# Patient Record
Sex: Male | Born: 1950 | Race: White | Hispanic: No | Marital: Single | Smoking: Never smoker
Health system: Southern US, Community
[De-identification: ages and names within clinical notes are randomized; demographics above are authoritative.]

## PROBLEM LIST (undated history)

## (undated) DIAGNOSIS — C4491 Basal cell carcinoma of skin, unspecified: Secondary | ICD-10-CM

## (undated) DIAGNOSIS — N189 Chronic kidney disease, unspecified: Secondary | ICD-10-CM

## (undated) DIAGNOSIS — I10 Essential (primary) hypertension: Secondary | ICD-10-CM

## (undated) DIAGNOSIS — M1990 Unspecified osteoarthritis, unspecified site: Secondary | ICD-10-CM

## (undated) DIAGNOSIS — E119 Type 2 diabetes mellitus without complications: Secondary | ICD-10-CM

## (undated) DIAGNOSIS — Z973 Presence of spectacles and contact lenses: Secondary | ICD-10-CM

## (undated) DIAGNOSIS — D649 Anemia, unspecified: Secondary | ICD-10-CM

## (undated) HISTORY — DX: Chronic kidney disease, unspecified: N18.9

---

## 2000-04-23 ENCOUNTER — Ambulatory Visit (HOSPITAL_COMMUNITY): Admission: RE | Admit: 2000-04-23 | Discharge: 2000-04-24 | Payer: Self-pay | Admitting: Ophthalmology

## 2002-04-13 ENCOUNTER — Encounter: Payer: Self-pay | Admitting: Family Medicine

## 2002-04-13 ENCOUNTER — Inpatient Hospital Stay (HOSPITAL_COMMUNITY): Admission: EM | Admit: 2002-04-13 | Discharge: 2002-04-18 | Payer: Self-pay

## 2002-09-11 HISTORY — PX: RETINAL DETACHMENT SURGERY: SHX105

## 2012-08-21 ENCOUNTER — Other Ambulatory Visit (HOSPITAL_COMMUNITY): Payer: Self-pay | Admitting: Family Medicine

## 2012-08-21 DIAGNOSIS — N289 Disorder of kidney and ureter, unspecified: Secondary | ICD-10-CM

## 2012-08-26 ENCOUNTER — Ambulatory Visit (HOSPITAL_COMMUNITY)
Admission: RE | Admit: 2012-08-26 | Discharge: 2012-08-26 | Disposition: A | Discharge status: Home / Self Care | Payer: BC Managed Care – PPO | Source: Ambulatory Visit | Attending: Family Medicine | Admitting: Family Medicine

## 2012-08-26 DIAGNOSIS — N289 Disorder of kidney and ureter, unspecified: Secondary | ICD-10-CM

## 2012-08-26 DIAGNOSIS — N281 Cyst of kidney, acquired: Secondary | ICD-10-CM | POA: Insufficient documentation

## 2013-01-10 ENCOUNTER — Encounter (HOSPITAL_BASED_OUTPATIENT_CLINIC_OR_DEPARTMENT_OTHER): Payer: Self-pay | Admitting: *Deleted

## 2013-01-10 ENCOUNTER — Encounter (HOSPITAL_BASED_OUTPATIENT_CLINIC_OR_DEPARTMENT_OTHER)
Admission: RE | Admit: 2013-01-10 | Discharge: 2013-01-10 | Disposition: A | Discharge status: Home / Self Care | Payer: BC Managed Care – PPO | Source: Ambulatory Visit | Attending: Plastic Surgery | Admitting: Plastic Surgery

## 2013-01-10 ENCOUNTER — Other Ambulatory Visit: Payer: Self-pay

## 2013-01-10 ENCOUNTER — Other Ambulatory Visit: Payer: Self-pay | Admitting: Physician Assistant

## 2013-01-10 DIAGNOSIS — M952 Other acquired deformity of head: Secondary | ICD-10-CM | POA: Insufficient documentation

## 2013-01-10 DIAGNOSIS — C44309 Unspecified malignant neoplasm of skin of other parts of face: Secondary | ICD-10-CM

## 2013-01-10 LAB — BASIC METABOLIC PANEL
BUN: 22 mg/dL (ref 6–23)
Chloride: 102 mEq/L (ref 96–112)
GFR calc Af Amer: 60 mL/min — ABNORMAL LOW (ref >90)
Potassium: 6.3 mEq/L (ref 3.5–5.1)
Sodium: 134 mEq/L — ABNORMAL LOW (ref 135–145)

## 2013-01-10 NOTE — Progress Notes (Signed)
Labs came back k-6.3 Had called for pcp labs and notes-called again Dr Kelly Splinter notified-Shawn-PA said to repeat [facility]

## 2013-01-10 NOTE — Progress Notes (Signed)
Pt here for bloodwork and EKG. Abnormal EKG reviewed with Dr. Chaney Malling.

## 2013-01-10 NOTE — H&P (Signed)
  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  He has high blood pressure and diabetes.  He is not sure how long the lesion had been present.  According to the notes, it was a 5 x 3 cm sclerotic illdefined plaque with an erythematous nodule. He has not done anything with it over the past week but kept the dressing in place. There is exudate on the area and a foul odor and Silvercel was placed on the area earlier this week. We are working on getting him to the OR for either a STSG or full thickness graft. He is seen for wound re-check today. There is exudate on the area and a foul odor and silvadene cream was applied to the wound and covered with gauze.  The wound measures 7.0 cm x 7.5 cm x 0.2 cm.    The following portions of the patient's history were reviewed and updated as appropriate: allergies, current medications, past family history, past medical history, past social history, past surgical history and problem list.  Review of Systems  All other systems reviewed and are negative.  Objective:     Physical Exam  Constitutional: He is oriented to person, place, and time. He appears well-developed and well-nourished. No distress.  HENT:   Nose: Nose normal.   Mouth/Throat: Oropharynx is clear and moist.  The forehead wound is - There is exudate on the area and a foul odor and silvadene cream was applied to the wound and covered with gauze.  The wound measures 7.0 cm x 7.5 cm x 0.2 cm.   Eyes: EOM are normal. Pupils are equal, round, and reactive to light.  Neck: Normal range of motion. Neck supple.  Cardiovascular: Normal rate and regular rhythm.   Pulmonary/Chest: Effort normal. No respiratory distress.  Musculoskeletal: Normal range of motion.  Neurological: He is alert and oriented to person, place, and time.  Skin: Skin is warm and dry.  Psychiatric: He has a normal mood and  affect. His behavior is normal. Judgment and thought content normal.  Assessment:        1.  Mohs defect of forehead    2.  Skin cancer of forehead        Plan:      To OR for closure of Mohs defect with either split thickness or full thickness skin graft as soon as possible.  Will start empiric Levaquin as wound with foul odor and exudate.

## 2013-01-10 NOTE — Progress Notes (Signed)
To come in for [id number] dr wilson-called for notes

## 2013-01-13 ENCOUNTER — Other Ambulatory Visit: Payer: Self-pay | Admitting: Plastic Surgery

## 2013-01-13 ENCOUNTER — Encounter (HOSPITAL_BASED_OUTPATIENT_CLINIC_OR_DEPARTMENT_OTHER): Payer: Self-pay | Admitting: Certified Registered"

## 2013-01-13 ENCOUNTER — Encounter (HOSPITAL_BASED_OUTPATIENT_CLINIC_OR_DEPARTMENT_OTHER)
Admission: RE | Disposition: A | Discharge status: Home / Self Care | Payer: Self-pay | Source: Ambulatory Visit | Attending: Plastic Surgery

## 2013-01-13 ENCOUNTER — Ambulatory Visit (HOSPITAL_BASED_OUTPATIENT_CLINIC_OR_DEPARTMENT_OTHER): Payer: BC Managed Care – PPO | Admitting: Certified Registered"

## 2013-01-13 ENCOUNTER — Ambulatory Visit (HOSPITAL_BASED_OUTPATIENT_CLINIC_OR_DEPARTMENT_OTHER)
Admission: RE | Admit: 2013-01-13 | Discharge: 2013-01-13 | Disposition: A | Discharge status: Home / Self Care | Payer: BC Managed Care – PPO | Source: Ambulatory Visit | Attending: Plastic Surgery | Admitting: Plastic Surgery

## 2013-01-13 ENCOUNTER — Encounter (HOSPITAL_BASED_OUTPATIENT_CLINIC_OR_DEPARTMENT_OTHER): Payer: Self-pay | Admitting: *Deleted

## 2013-01-13 DIAGNOSIS — Z481 Encounter for planned postprocedural wound closure: Secondary | ICD-10-CM | POA: Insufficient documentation

## 2013-01-13 DIAGNOSIS — I10 Essential (primary) hypertension: Secondary | ICD-10-CM | POA: Insufficient documentation

## 2013-01-13 DIAGNOSIS — E119 Type 2 diabetes mellitus without complications: Secondary | ICD-10-CM | POA: Insufficient documentation

## 2013-01-13 DIAGNOSIS — C44319 Basal cell carcinoma of skin of other parts of face: Secondary | ICD-10-CM | POA: Insufficient documentation

## 2013-01-13 HISTORY — DX: Anemia, unspecified: D64.9

## 2013-01-13 HISTORY — DX: Essential (primary) hypertension: I10

## 2013-01-13 HISTORY — DX: Unspecified osteoarthritis, unspecified site: M19.90

## 2013-01-13 HISTORY — DX: Presence of spectacles and contact lenses: Z97.3

## 2013-01-13 HISTORY — PX: SKIN SPLIT GRAFT: SHX444

## 2013-01-13 HISTORY — DX: Type 2 diabetes mellitus without complications: E11.9

## 2013-01-13 LAB — POCT I-STAT, CHEM 8
BUN: 33 mg/dL — ABNORMAL HIGH (ref 6–23)
Chloride: 106 mEq/L (ref 96–112)
Creatinine, Ser: 1.5 mg/dL — ABNORMAL HIGH (ref 0.50–1.35)
Glucose, Bld: 190 mg/dL — ABNORMAL HIGH (ref 70–99)
Hemoglobin: 11.6 g/dL — ABNORMAL LOW (ref 13.0–17.0)
Potassium: 5.4 mEq/L — ABNORMAL HIGH (ref 3.5–5.1)
Sodium: 135 mEq/L (ref 135–145)

## 2013-01-13 SURGERY — APPLICATION, GRAFT, SKIN, SPLIT-THICKNESS
Anesthesia: General | Site: Face | Wound class: Contaminated

## 2013-01-13 MED ORDER — PROPOFOL 10 MG/ML IV BOLUS
INTRAVENOUS | Status: DC | PRN
  Administered 2013-01-13: 120 mg via INTRAVENOUS

## 2013-01-13 MED ORDER — SODIUM CHLORIDE 0.9 % IR SOLN
Status: DC | PRN
  Administered 2013-01-13: 15:00:00

## 2013-01-13 MED ORDER — LACTATED RINGERS IV SOLN
INTRAVENOUS | Status: DC
  Administered 2013-01-13 (×2): via INTRAVENOUS

## 2013-01-13 MED ORDER — EPHEDRINE SULFATE 50 MG/ML IJ SOLN
INTRAMUSCULAR | Status: DC | PRN
  Administered 2013-01-13: 5 mg via INTRAVENOUS

## 2013-01-13 MED ORDER — OXYCODONE HCL 5 MG/5ML PO SOLN: 5.0000 mg | Freq: Once | ORAL | Status: DC | PRN

## 2013-01-13 MED ORDER — CEFAZOLIN SODIUM-DEXTROSE 2-3 GM-% IV SOLR
2.0000 g | INTRAVENOUS | Status: AC
  Administered 2013-01-13: 2 g via INTRAVENOUS

## 2013-01-13 MED ORDER — BUPIVACAINE-EPINEPHRINE 0.25% -1:200000 IJ SOLN
INTRAMUSCULAR | Status: DC | PRN
  Administered 2013-01-13: 10 mL

## 2013-01-13 MED ORDER — MIDAZOLAM HCL 5 MG/5ML IJ SOLN
INTRAMUSCULAR | Status: DC | PRN
  Administered 2013-01-13: 1 mg via INTRAVENOUS

## 2013-01-13 MED ORDER — ONDANSETRON HCL 4 MG/2ML IJ SOLN
INTRAMUSCULAR | Status: DC | PRN
  Administered 2013-01-13: 4 mg via INTRAVENOUS

## 2013-01-13 MED ORDER — METOCLOPRAMIDE HCL 5 MG/ML IJ SOLN: 10.0000 mg | Freq: Once | INTRAMUSCULAR | Status: DC | PRN

## 2013-01-13 MED ORDER — FENTANYL CITRATE 0.05 MG/ML IJ SOLN
INTRAMUSCULAR | Status: DC | PRN
  Administered 2013-01-13: 25 ug via INTRAVENOUS
  Administered 2013-01-13: 50 ug via INTRAVENOUS
  Administered 2013-01-13: 25 ug via INTRAVENOUS
  Administered 2013-01-13: 50 ug via INTRAVENOUS
  Administered 2013-01-13 (×2): 25 ug via INTRAVENOUS

## 2013-01-13 MED ORDER — HYDROMORPHONE HCL PF 1 MG/ML IJ SOLN: 0.2500 mg | INTRAMUSCULAR | Status: DC | PRN

## 2013-01-13 MED ORDER — OXYCODONE HCL 5 MG PO TABS: 5.0000 mg | ORAL_TABLET | Freq: Once | ORAL | Status: DC | PRN

## 2013-01-13 SURGICAL SUPPLY — 75 items
BAG DECANTER FOR FLEXI CONT (MISCELLANEOUS) IMPLANT
BANDAGE ELASTIC 3 VELCRO ST LF (GAUZE/BANDAGES/DRESSINGS) IMPLANT
BANDAGE ELASTIC 4 VELCRO ST LF (GAUZE/BANDAGES/DRESSINGS) IMPLANT
BANDAGE ELASTIC 6 VELCRO ST LF (GAUZE/BANDAGES/DRESSINGS) IMPLANT
BANDAGE GAUZE ELAST BULKY 4 IN (GAUZE/BANDAGES/DRESSINGS) IMPLANT
BENZOIN TINCTURE PRP APPL 2/3 (GAUZE/BANDAGES/DRESSINGS) IMPLANT
BLADE DERMATOME SS (BLADE) ×2 IMPLANT
BLADE HEX COATED 2.75 (ELECTRODE) ×2 IMPLANT
BLADE SURG 10 STRL SS (BLADE) ×2 IMPLANT
BLADE SURG 15 STRL LF DISP TIS (BLADE) ×1 IMPLANT
BLADE SURG 15 STRL SS (BLADE) ×1
BLADE SURG ROTATE 9660 (MISCELLANEOUS) IMPLANT
BNDG COHESIVE 4X5 TAN STRL (GAUZE/BANDAGES/DRESSINGS) IMPLANT
CANISTER SUCTION 1200CC (MISCELLANEOUS) ×2 IMPLANT
CLOTH BEACON ORANGE TIMEOUT ST (SAFETY) ×2 IMPLANT
COTTONBALL LRG STERILE PKG (GAUZE/BANDAGES/DRESSINGS) IMPLANT
COVER MAYO STAND STRL (DRAPES) ×2 IMPLANT
COVER TABLE BACK 60X90 (DRAPES) ×2 IMPLANT
DECANTER SPIKE VIAL GLASS SM (MISCELLANEOUS) IMPLANT
DERMABOND ADVANCED (GAUZE/BANDAGES/DRESSINGS)
DERMABOND ADVANCED .7 DNX12 (GAUZE/BANDAGES/DRESSINGS) IMPLANT
DERMACARRIERS GRAFT 1 TO 1.5 (DISPOSABLE)
DRAPE EXTREMITY T 121X128X90 (DRAPE) IMPLANT
DRAPE INCISE IOBAN 66X45 STRL (DRAPES) ×2 IMPLANT
DRAPE PED LAPAROTOMY (DRAPES) IMPLANT
DRAPE SURG 17X23 STRL (DRAPES) IMPLANT
DRAPE U-SHAPE 76X120 STRL (DRAPES) ×2 IMPLANT
DRSG ADAPTIC 3X8 NADH LF (GAUZE/BANDAGES/DRESSINGS) IMPLANT
DRSG EMULSION OIL 3X3 NADH (GAUZE/BANDAGES/DRESSINGS) IMPLANT
DRSG OPSITE 6X11 MED (GAUZE/BANDAGES/DRESSINGS) IMPLANT
DRSG PAD ABDOMINAL 8X10 ST (GAUZE/BANDAGES/DRESSINGS) ×2 IMPLANT
DRSG TEGADERM 4X10 (GAUZE/BANDAGES/DRESSINGS) IMPLANT
ELECT REM PT RETURN 9FT ADLT (ELECTROSURGICAL)
ELECTRODE REM PT RTRN 9FT ADLT (ELECTROSURGICAL) IMPLANT
GAUZE XEROFORM 5X9 LF (GAUZE/BANDAGES/DRESSINGS) ×2 IMPLANT
GLOVE BIO SURGEON STRL SZ 6.5 (GLOVE) ×6 IMPLANT
GLOVE BIOGEL M STRL SZ7.5 (GLOVE) ×2 IMPLANT
GLOVE BIOGEL PI IND STRL 8 (GLOVE) ×1 IMPLANT
GLOVE BIOGEL PI INDICATOR 8 (GLOVE) ×1
GOWN PREVENTION PLUS XLARGE (GOWN DISPOSABLE) ×4 IMPLANT
GOWN STRL REIN 2XL LVL4 (GOWN DISPOSABLE) ×2 IMPLANT
GRAFT DERMACARRIERS 1 TO 1.5 (DISPOSABLE) IMPLANT
MATRIX SURGICAL PSMX 7X10CM (Tissue) ×2 IMPLANT
NEEDLE 27GAX1X1/2 (NEEDLE) ×2 IMPLANT
NS IRRIG 1000ML POUR BTL (IV SOLUTION) ×2 IMPLANT
PACK BASIN DAY SURGERY FS (CUSTOM PROCEDURE TRAY) ×2 IMPLANT
PAD CAST 3X4 CTTN HI CHSV (CAST SUPPLIES) IMPLANT
PAD CAST 4YDX4 CTTN HI CHSV (CAST SUPPLIES) IMPLANT
PADDING CAST ABS 4INX4YD NS (CAST SUPPLIES)
PADDING CAST ABS COTTON 4X4 ST (CAST SUPPLIES) IMPLANT
PADDING CAST COTTON 3X4 STRL (CAST SUPPLIES)
PADDING CAST COTTON 4X4 STRL (CAST SUPPLIES)
PENCIL BUTTON HOLSTER BLD 10FT (ELECTRODE) IMPLANT
SHEET MEDIUM DRAPE 40X70 STRL (DRAPES) IMPLANT
SPONGE GAUZE 4X4 12PLY (GAUZE/BANDAGES/DRESSINGS) ×4 IMPLANT
SPONGE LAP 18X18 X RAY DECT (DISPOSABLE) ×2 IMPLANT
STAPLER VISISTAT 35W (STAPLE) IMPLANT
STOCKINETTE 4X48 STRL (DRAPES) IMPLANT
STOCKINETTE 6  STRL (DRAPES)
STOCKINETTE 6 STRL (DRAPES) IMPLANT
STOCKINETTE IMPERVIOUS LG (DRAPES) IMPLANT
STRIP CLOSURE SKIN 1/2X4 (GAUZE/BANDAGES/DRESSINGS) IMPLANT
SURGILUBE 2OZ TUBE FLIPTOP (MISCELLANEOUS) IMPLANT
SUT MON AB 5-0 PS2 18 (SUTURE) ×4 IMPLANT
SUT SILK 3 0 SH CR/8 (SUTURE) IMPLANT
SUT SILK 4 0 SH CR/8 (SUTURE) IMPLANT
SUT VIC AB 3-0 FS2 27 (SUTURE) ×2 IMPLANT
SUT VIC AB 5-0 P-3 18X BRD (SUTURE) ×1 IMPLANT
SUT VIC AB 5-0 P3 18 (SUTURE) ×1
SUT VICRYL 4-0 PS2 18IN ABS (SUTURE) ×18 IMPLANT
SYR BULB IRRIGATION 50ML (SYRINGE) ×2 IMPLANT
SYR CONTROL 10ML LL (SYRINGE) ×2 IMPLANT
TOWEL OR 17X24 6PK STRL BLUE (TOWEL DISPOSABLE) ×2 IMPLANT
TRAY DSU PREP LF (CUSTOM PROCEDURE TRAY) ×2 IMPLANT
UNDERPAD 30X30 INCONTINENT (UNDERPADS AND DIAPERS) IMPLANT

## 2013-01-13 NOTE — Interval H&P Note (Signed)
History and Physical Interval Note:  01/13/2013 12:54 PM  Micheal Goodman  has presented today for surgery, with the diagnosis of BASIL CELL CARCINOMA OF FOREHEAD  The various methods of treatment have been discussed with the patient and family. After consideration of risks, benefits and other options for treatment, the patient has consented to  Procedure(s): SPLIT THICKNESS VERSES FULL THICKNESS SKIN GRAFT OF THE FOREHEAD (N/A) as a surgical intervention .  The patient's history has been reviewed, patient examined, no change in status, stable for surgery.  I have reviewed the patient's chart and labs.  Questions were answered to the patient's satisfaction.     SANGER,Serra Younan

## 2013-01-13 NOTE — Op Note (Signed)
Split Thickness Skin Graft Procedure Note  Indications: The patient is a 62 y.o. male with an open wound of the forehead. I recommended irrigation & debridement with STSG closure.  Pre-operative Diagnosis: Open wound of the forehead after mohs excision of a basal cell carcinoma  Post-operative Diagnosis: Same  Procedure: split thickness skin graft to the forehead 7 x 8 cm with Acell to the donor site.  Surgeon: Wayland Denis   Assistants: Lazaro Arms, PA  Anesthesia: General  Procedure Details  The patient was seen in the Holding Room. The risks, benefits, complications, treatment options, and expected outcomes were discussed with the patient. The possibilities of reaction to medication, pulmonary aspiration, bleeding, recurrent infection, the need for additional procedures, failure to diagnose a condition, and creating a complication requiring operation were discussed with the patient. The patient concurred with the proposed plan, giving informed consent.  The site of surgery properly noted/marked. The patient was taken to Operating Room, identified as Marlon Suleiman and the procedure verified as Debridement with STSG. A Time Out was held and the above information confirmed.  The patient was placed supine after induction of a general anesthetic. The wound was then irrigated with the pulse lavage system, using normal saline mixed with antibiotic.  The wound area was then prepped with Betadine and draped in the usual sterile fashion. The wound was dbrided using sharp dissection to the margins of viable tissue. Excellent bleeding was encountered.   A dermatome was set at 0.0114 inches with a 3 inch guard. A split-thickness skin graft was harvested from the right anterior thigh. The graft measured 7 by 8 cm. The skin graft was placed over the defect. The skin graft was trimmed to fit, and secured into place using 4-0 Vicryl. The donor site was covered with a piece of Acell, followed by Adaptic  surgical lube and Iobane.   Xeroform and cotton were placed over the skin graft and secured with the bolster. The patient was then allowed to wake up and extubated without difficulty.  Instrument, sponge, and needle counts were correct prior to the wound closure and at the conclusion of the case.   Estimated Blood Loss:  Minimal          Complications:  None; patient tolerated the procedure well.         Disposition: PACU - hemodynamically stable.         Condition: stable

## 2013-01-13 NOTE — Anesthesia Procedure Notes (Signed)
Procedure Name: LMA Insertion Date/Time: 01/13/2013 2:15 PM Performed by: Verlan Friends Pre-anesthesia Checklist: Patient identified, Emergency Drugs available, Suction available, Patient being monitored and Timeout performed Patient Re-evaluated:Patient Re-evaluated prior to inductionOxygen Delivery Method: Circle System Utilized Preoxygenation: Pre-oxygenation with 100% oxygen Intubation Type: IV induction Ventilation: Mask ventilation without difficulty LMA: LMA inserted LMA Size: 4.0 Number of attempts: 1 Airway Equipment and Method: bite block Placement Confirmation: positive ETCO2 Tube secured with: Tape Dental Injury: Teeth and Oropharynx as per pre-operative assessment

## 2013-01-13 NOTE — H&P (Signed)
  This document contains confidential information from a [facility] medical record system and may be unauthenticated. Release may be made only with a valid authorization or in accordance with applicable policies of Medical Center or its affiliates. This document must be maintained in a secure manner or discarded/destroyed as required by Medical Center policy or by a confidential means such as shredding.   Micheal Goodman  01/07/2013 8:00 AM   Initial consult  MRN:  [id number]  Department: Plastic Surgery  Dept Phone: [phone]  Description: Male DOB: Jan 30, 1951  Provider: Wayland Denis, DO    Diagnoses -  Mohs defect of forehead    -  Primary   738.19     Reason for Visit -  Skin Problem    Vitals - Last Recorded    106/65  77  1.803 m (5\' 11" )  68.04 kg (150 lb)  20.93 kg/m2          Subjective:    Patient ID: Micheal Goodman is a 62 y.o. male.  HPI The patient is a 62 yrs old wm here for evaluation of a large forehead defect after mohs excision of a basal cell carcinoma with fibrosis done last week.  He has high blood pressure and diabetes.  He is not sure how long the lesion had been present.  According to the notes, it was a 5 x 3 cm sclerotic illdefined plaque with an erythematous nodule. He has not done anything with it over the past week but kept the dressing in place. There is exudate on the area.  The following portions of the patient's history were reviewed and updated as appropriate: allergies, current medications, past family history, past medical history, past social history, past surgical history and problem list.  Review of Systems  Constitutional: Negative.   HENT: Negative.   Eyes: Negative.   Respiratory: Negative.   Cardiovascular: Negative.   Endocrine: Negative.   Genitourinary: Negative.   Psychiatric/Behavioral: Negative.     Objective:   Physical Exam  Constitutional: He appears well-developed.  HENT:   Head: Normocephalic.   Cardiovascular: Normal rate.   Pulmonary/Chest: Effort normal.  Abdominal: Soft.  Neurological: He is alert.  Psychiatric: He has a normal mood and affect. His behavior is normal. Judgment and thought content normal.   Assessment:   1.  Mohs defect of forehead        Plan:     We discussed the need for reconstruction of the forehead.  We will start with a skin graft due to the size and as it contracts we will be able to improve the appearance later. We will place silvercell on the area while arranging for the OR. Brother - [phone]

## 2013-01-13 NOTE — Anesthesia Preprocedure Evaluation (Addendum)
Anesthesia Evaluation  Patient identified by MRN, date of birth, ID band Patient awake    Reviewed: Allergy & Precautions, H&P , NPO status , Patient's Chart, lab work & pertinent test results, reviewed documented beta blocker date and time   History of Anesthesia Complications Negative for: history of anesthetic complications  Airway Mallampati: II TM Distance: >3 FB Neck ROM: full    Dental  (+) Poor Dentition, Missing, Chipped and Dental Advisory Given   Pulmonary neg pulmonary ROS,  breath sounds clear to auscultation  Pulmonary exam normal       Cardiovascular hypertension, On Medications and On Home Beta Blockers Rhythm:regular Rate:Normal     Neuro/Psych negative neurological ROS  negative psych ROS   GI/Hepatic negative GI ROS, Neg liver ROS,   Endo/Other  diabetes (glu 190), Well Controlled, Type 2, Oral Hypoglycemic Agents and Insulin Dependent  Renal/GU Renal InsufficiencyRenal disease (creat 1.50, K+ 5.4)  negative genitourinary   Musculoskeletal   Abdominal   Peds  Hematology  (+) anemia ,   Anesthesia Other Findings See surgeon's H&P   Reproductive/Obstetrics negative OB ROS                        Anesthesia Physical Anesthesia Plan  ASA: III  Anesthesia Plan: General   Post-op Pain Management:    Induction: Intravenous  Airway Management Planned: LMA  Additional Equipment:   Intra-op Plan:   Post-operative Plan:   Informed Consent: I have reviewed the patients History and Physical, chart, labs and discussed the procedure including the risks, benefits and alternatives for the proposed anesthesia with the patient or authorized representative who has indicated his/her understanding and acceptance.   Dental Advisory Given  Plan Discussed with: CRNA and Surgeon  Anesthesia Plan Comments: (Plan routine monitors, GA- LMA OK)       Anesthesia Quick Evaluation

## 2013-01-13 NOTE — Brief Op Note (Signed)
01/13/2013  3:30 PM  PATIENT:  Micheal Goodman  62 y.o. male  PRE-OPERATIVE DIAGNOSIS:  BASAL CELL CARCINOMA OF FOREHEAD  POST-OPERATIVE DIAGNOSIS:  BASAL CELL CARCINOMA OF FOREHEAD  PROCEDURE:  Procedure(s) with comments: SPLIT THICKNESS SKIN GRAFT OF THE FOREHEAD (N/A) - forehead Donor site right thigh. Split thickness skin graft  SURGEON:  Surgeon(s) and Role:    * Hunt Zajicek Sanger, DO - Primary  PHYSICIAN ASSISTANT: Shawn Rayburn, PA  ASSISTANTS: none   ANESTHESIA:   local and general  EBL:  Total I/O In: 1600 [I.V.:1600] Out: -   BLOOD ADMINISTERED:none  DRAINS: none   LOCAL MEDICATIONS USED:  MARCAINE     SPECIMEN:  No Specimen  DISPOSITION OF SPECIMEN:  N/A  COUNTS:  YES  TOURNIQUET:  * No tourniquets in log *  DICTATION: .Dragon Dictation  PLAN OF CARE: Discharge to home after PACU  PATIENT DISPOSITION:  PACU - hemodynamically stable.   Delay start of Pharmacological VTE agent (>24hrs) due to surgical blood loss or risk of bleeding: no

## 2013-01-13 NOTE — Transfer of Care (Signed)
Immediate Anesthesia Transfer of Care Note  Patient: Micheal Goodman  Procedure(s) Performed: Procedure(s) with comments: SPLIT THICKNESS SKIN GRAFT OF THE FOREHEAD (N/A) - forehead Donor site right thigh. Split thickness skin graft  Patient Location: PACU  Anesthesia Type:General  Level of Consciousness: sedated  Airway & Oxygen Therapy: Patient Spontanous Breathing and Patient connected to face mask oxygen  Post-op Assessment: Report given to PACU RN and Post -op Vital signs reviewed and stable  Post vital signs: Reviewed and stable  Complications: No apparent anesthesia complications

## 2013-01-13 NOTE — H&P (View-Only) (Signed)
  This document contains confidential information from a [facility] medical record system and may be unauthenticated. Release may be made only with a valid authorization or in accordance with applicable policies of Medical Center or its affiliates. This document must be maintained in a secure manner or discarded/destroyed as required by Medical Center policy or by a confidential means such as shredding.   Micheal Goodman  01/07/2013 8:00 AM   Initial consult  MRN:  [id number]  Department: Plastic Surgery  Dept Phone: [phone]  Description: Male DOB: 09/27/1950  Provider: Claire Sanger, DO    Diagnoses -  Mohs defect of forehead    -  Primary   738.19     Reason for Visit -  Skin Problem    Vitals - Last Recorded    106/65  77  1.803 m (5' 11")  68.04 kg (150 lb)  20.93 kg/m2          Subjective:    Patient ID: Micheal Goodman is a 62 y.o. male.  HPI The patient is a 62 yrs old wm here for evaluation of a large forehead defect after mohs excision of a basal cell carcinoma with fibrosis done last week.  He has high blood pressure and diabetes.  He is not sure how long the lesion had been present.  According to the notes, it was a 5 x 3 cm sclerotic illdefined plaque with an erythematous nodule. He has not done anything with it over the past week but kept the dressing in place. There is exudate on the area.  The following portions of the patient's history were reviewed and updated as appropriate: allergies, current medications, past family history, past medical history, past social history, past surgical history and problem list.  Review of Systems  Constitutional: Negative.   HENT: Negative.   Eyes: Negative.   Respiratory: Negative.   Cardiovascular: Negative.   Endocrine: Negative.   Genitourinary: Negative.   Psychiatric/Behavioral: Negative.     Objective:   Physical Exam  Constitutional: He appears well-developed.  HENT:   Head: Normocephalic.   Cardiovascular: Normal rate.   Pulmonary/Chest: Effort normal.  Abdominal: Soft.  Neurological: He is alert.  Psychiatric: He has a normal mood and affect. His behavior is normal. Judgment and thought content normal.   Assessment:   1.  Mohs defect of forehead        Plan:     We discussed the need for reconstruction of the forehead.  We will start with a skin graft due to the size and as it contracts we will be able to improve the appearance later. We will place silvercell on the area while arranging for the OR. Brother - [phone]    

## 2013-01-14 ENCOUNTER — Encounter (HOSPITAL_BASED_OUTPATIENT_CLINIC_OR_DEPARTMENT_OTHER): Payer: Self-pay | Admitting: Plastic Surgery

## 2013-01-14 NOTE — Anesthesia Postprocedure Evaluation (Signed)
  Anesthesia Post-op Note  Patient: Micheal Goodman  Procedure(s) Performed: Procedure(s) with comments: SPLIT THICKNESS SKIN GRAFT OF THE FOREHEAD (N/A) - Forehead;  Donor site right thigh. Split thickness skin graft  Patient was discharged prior to post op note.  No known anesthetic complications.

## 2013-11-21 ENCOUNTER — Encounter (HOSPITAL_BASED_OUTPATIENT_CLINIC_OR_DEPARTMENT_OTHER): Payer: Self-pay | Admitting: Emergency Medicine

## 2013-11-21 ENCOUNTER — Emergency Department (HOSPITAL_BASED_OUTPATIENT_CLINIC_OR_DEPARTMENT_OTHER): Payer: BC Managed Care – PPO

## 2013-11-21 ENCOUNTER — Inpatient Hospital Stay (HOSPITAL_BASED_OUTPATIENT_CLINIC_OR_DEPARTMENT_OTHER)
Admission: EM | Admit: 2013-11-21 | Discharge: 2013-12-01 | DRG: 682 | Disposition: A | Discharge status: Skilled Nursing Facility | Payer: BC Managed Care – PPO | Attending: Internal Medicine | Admitting: Internal Medicine

## 2013-11-21 DIAGNOSIS — Z85828 Personal history of other malignant neoplasm of skin: Secondary | ICD-10-CM

## 2013-11-21 DIAGNOSIS — Z22322 Carrier or suspected carrier of Methicillin resistant Staphylococcus aureus: Secondary | ICD-10-CM

## 2013-11-21 DIAGNOSIS — E861 Hypovolemia: Secondary | ICD-10-CM | POA: Diagnosis present

## 2013-11-21 DIAGNOSIS — X31XXXA Exposure to excessive natural cold, initial encounter: Secondary | ICD-10-CM | POA: Diagnosis present

## 2013-11-21 DIAGNOSIS — E11319 Type 2 diabetes mellitus with unspecified diabetic retinopathy without macular edema: Secondary | ICD-10-CM | POA: Diagnosis present

## 2013-11-21 DIAGNOSIS — D649 Anemia, unspecified: Secondary | ICD-10-CM | POA: Diagnosis present

## 2013-11-21 DIAGNOSIS — Z7982 Long term (current) use of aspirin: Secondary | ICD-10-CM

## 2013-11-21 DIAGNOSIS — E162 Hypoglycemia, unspecified: Secondary | ICD-10-CM | POA: Diagnosis present

## 2013-11-21 DIAGNOSIS — N183 Chronic kidney disease, stage 3 unspecified: Secondary | ICD-10-CM | POA: Diagnosis present

## 2013-11-21 DIAGNOSIS — D638 Anemia in other chronic diseases classified elsewhere: Secondary | ICD-10-CM | POA: Diagnosis present

## 2013-11-21 DIAGNOSIS — E1169 Type 2 diabetes mellitus with other specified complication: Secondary | ICD-10-CM | POA: Diagnosis present

## 2013-11-21 DIAGNOSIS — Z794 Long term (current) use of insulin: Secondary | ICD-10-CM

## 2013-11-21 DIAGNOSIS — E860 Dehydration: Secondary | ICD-10-CM | POA: Diagnosis present

## 2013-11-21 DIAGNOSIS — N179 Acute kidney failure, unspecified: Secondary | ICD-10-CM

## 2013-11-21 DIAGNOSIS — D696 Thrombocytopenia, unspecified: Secondary | ICD-10-CM | POA: Diagnosis present

## 2013-11-21 DIAGNOSIS — D539 Nutritional anemia, unspecified: Secondary | ICD-10-CM | POA: Diagnosis present

## 2013-11-21 DIAGNOSIS — C44309 Unspecified malignant neoplasm of skin of other parts of face: Secondary | ICD-10-CM

## 2013-11-21 DIAGNOSIS — E1149 Type 2 diabetes mellitus with other diabetic neurological complication: Secondary | ICD-10-CM | POA: Diagnosis present

## 2013-11-21 DIAGNOSIS — E538 Deficiency of other specified B group vitamins: Secondary | ICD-10-CM | POA: Diagnosis present

## 2013-11-21 DIAGNOSIS — E785 Hyperlipidemia, unspecified: Secondary | ICD-10-CM | POA: Diagnosis present

## 2013-11-21 DIAGNOSIS — G929 Unspecified toxic encephalopathy: Secondary | ICD-10-CM | POA: Diagnosis present

## 2013-11-21 DIAGNOSIS — E119 Type 2 diabetes mellitus without complications: Secondary | ICD-10-CM | POA: Diagnosis present

## 2013-11-21 DIAGNOSIS — I129 Hypertensive chronic kidney disease with stage 1 through stage 4 chronic kidney disease, or unspecified chronic kidney disease: Secondary | ICD-10-CM | POA: Diagnosis present

## 2013-11-21 DIAGNOSIS — D61818 Other pancytopenia: Secondary | ICD-10-CM | POA: Diagnosis present

## 2013-11-21 DIAGNOSIS — T68XXXA Hypothermia, initial encounter: Secondary | ICD-10-CM | POA: Diagnosis present

## 2013-11-21 DIAGNOSIS — G92 Toxic encephalopathy: Secondary | ICD-10-CM | POA: Diagnosis present

## 2013-11-21 DIAGNOSIS — Q549 Hypospadias, unspecified: Secondary | ICD-10-CM

## 2013-11-21 DIAGNOSIS — E872 Acidosis, unspecified: Secondary | ICD-10-CM | POA: Diagnosis present

## 2013-11-21 DIAGNOSIS — N35919 Unspecified urethral stricture, male, unspecified site: Secondary | ICD-10-CM | POA: Diagnosis present

## 2013-11-21 DIAGNOSIS — M129 Arthropathy, unspecified: Secondary | ICD-10-CM | POA: Diagnosis present

## 2013-11-21 DIAGNOSIS — N170 Acute kidney failure with tubular necrosis: Principal | ICD-10-CM | POA: Diagnosis present

## 2013-11-21 DIAGNOSIS — E1139 Type 2 diabetes mellitus with other diabetic ophthalmic complication: Secondary | ICD-10-CM | POA: Diagnosis present

## 2013-11-21 DIAGNOSIS — R627 Adult failure to thrive: Secondary | ICD-10-CM | POA: Diagnosis present

## 2013-11-21 DIAGNOSIS — E875 Hyperkalemia: Secondary | ICD-10-CM | POA: Diagnosis not present

## 2013-11-21 DIAGNOSIS — R5381 Other malaise: Secondary | ICD-10-CM | POA: Diagnosis present

## 2013-11-21 HISTORY — DX: Basal cell carcinoma of skin, unspecified: C44.91

## 2013-11-21 LAB — URINE MICROSCOPIC-ADD ON

## 2013-11-21 LAB — URINALYSIS, ROUTINE W REFLEX MICROSCOPIC
BILIRUBIN URINE: NEGATIVE
Glucose, UA: 100 mg/dL — AB
KETONES UR: NEGATIVE mg/dL
NITRITE: NEGATIVE
PROTEIN: NEGATIVE mg/dL
Specific Gravity, Urine: 1.015 (ref 1.005–1.030)
UROBILINOGEN UA: 0.2 mg/dL (ref 0.0–1.0)
pH: 5 (ref 5.0–8.0)

## 2013-11-21 LAB — CBC WITH DIFFERENTIAL/PLATELET
BASOS PCT: 0 % (ref 0–1)
Basophils Absolute: 0 10*3/uL (ref 0.0–0.1)
EOS ABS: 0 10*3/uL (ref 0.0–0.7)
Eosinophils Relative: 0 % (ref 0–5)
HCT: 25.5 % — ABNORMAL LOW (ref 39.0–52.0)
Hemoglobin: 8.5 g/dL — ABNORMAL LOW (ref 13.0–17.0)
Lymphocytes Relative: 7 % — ABNORMAL LOW (ref 12–46)
Lymphs Abs: 0.4 10*3/uL — ABNORMAL LOW (ref 0.7–4.0)
MCH: 37 pg — AB (ref 26.0–34.0)
MCHC: 33.3 g/dL (ref 30.0–36.0)
MCV: 110.9 fL — ABNORMAL HIGH (ref 78.0–100.0)
MONO ABS: 0.4 10*3/uL (ref 0.1–1.0)
Monocytes Relative: 7 % (ref 3–12)
NEUTROS PCT: 86 % — AB (ref 43–77)
Neutro Abs: 4.6 10*3/uL (ref 1.7–7.7)
PLATELETS: 126 10*3/uL — AB (ref 150–400)
RBC: 2.3 MIL/uL — ABNORMAL LOW (ref 4.22–5.81)
RDW: 16.4 % — ABNORMAL HIGH (ref 11.5–15.5)
WBC: 5.4 10*3/uL (ref 4.0–10.5)

## 2013-11-21 LAB — I-STAT ARTERIAL BLOOD GAS, ED
Acid-base deficit: 13 mmol/L — ABNORMAL HIGH (ref 0.0–2.0)
BICARBONATE: 12.5 meq/L — AB (ref 20.0–24.0)
O2 SAT: 99 %
PCO2 ART: 23.4 mmHg — AB (ref 35.0–45.0)
PO2 ART: 132 mmHg — AB (ref 80.0–100.0)
Patient temperature: 95.5
TCO2: 13 mmol/L (ref 0–100)
pH, Arterial: 7.326 — ABNORMAL LOW (ref 7.350–7.450)

## 2013-11-21 LAB — COMPREHENSIVE METABOLIC PANEL
ALT: <5 U/L (ref 0–53)
AST: 9 U/L (ref 0–37)
Albumin: 3.2 g/dL — ABNORMAL LOW (ref 3.5–5.2)
Alkaline Phosphatase: 45 U/L (ref 39–117)
BUN: 93 mg/dL — ABNORMAL HIGH (ref 6–23)
CO2: 15 mEq/L — ABNORMAL LOW (ref 19–32)
Calcium: 8.7 mg/dL (ref 8.4–10.5)
Chloride: 98 mEq/L (ref 96–112)
Creatinine, Ser: 7.4 mg/dL — ABNORMAL HIGH (ref 0.50–1.35)
GFR calc Af Amer: 8 mL/min — ABNORMAL LOW (ref >90)
GFR calc non Af Amer: 7 mL/min — ABNORMAL LOW (ref >90)
Glucose, Bld: 132 mg/dL — ABNORMAL HIGH (ref 70–99)
Potassium: 5.2 mEq/L (ref 3.7–5.3)
Sodium: 136 mEq/L — ABNORMAL LOW (ref 137–147)
Total Bilirubin: 0.2 mg/dL — ABNORMAL LOW (ref 0.3–1.2)
Total Protein: 6.2 g/dL (ref 6.0–8.3)

## 2013-11-21 LAB — GLUCOSE, CAPILLARY: GLUCOSE-CAPILLARY: 125 mg/dL — AB (ref 70–99)

## 2013-11-21 LAB — CBG MONITORING, ED
Glucose-Capillary: 88 mg/dL (ref 70–99)
Glucose-Capillary: 86 mg/dL (ref 70–99)

## 2013-11-21 LAB — TROPONIN I: Troponin I: <0.3 ng/mL (ref <0.30)

## 2013-11-21 LAB — RAPID URINE DRUG SCREEN, HOSP PERFORMED
Amphetamines: NOT DETECTED
Barbiturates: NOT DETECTED
Benzodiazepines: NOT DETECTED
COCAINE: NOT DETECTED
OPIATES: NOT DETECTED
Tetrahydrocannabinol: NOT DETECTED

## 2013-11-21 LAB — ETHANOL: Alcohol, Ethyl (B): <11 mg/dL (ref 0–11)

## 2013-11-21 LAB — PROTIME-INR
INR: 0.95 (ref 0.00–1.49)
Prothrombin Time: 12.5 seconds (ref 11.6–15.2)

## 2013-11-21 LAB — CK: Total CK: 37 U/L (ref 7–232)

## 2013-11-21 LAB — I-STAT CG4 LACTIC ACID, ED: LACTIC ACID, VENOUS: 2.82 mmol/L — AB (ref 0.5–2.2)

## 2013-11-21 LAB — MRSA PCR SCREENING: MRSA by PCR: POSITIVE — AB

## 2013-11-21 LAB — SODIUM, URINE, RANDOM: SODIUM UR: 63 meq/L

## 2013-11-21 LAB — OCCULT BLOOD X 1 CARD TO LAB, STOOL: Fecal Occult Bld: NEGATIVE

## 2013-11-21 LAB — CREATININE, URINE, RANDOM: Creatinine, Urine: 83.62 mg/dL

## 2013-11-21 MED ORDER — CHLORHEXIDINE GLUCONATE CLOTH 2 % EX PADS
6.0000 | MEDICATED_PAD | Freq: Every day | CUTANEOUS | Status: AC
  Administered 2013-11-22 – 2013-11-26 (×5): 6 via TOPICAL

## 2013-11-21 MED ORDER — SODIUM CHLORIDE 0.9 % IV SOLN
1000.0000 mL | INTRAVENOUS | Status: DC
  Administered 2013-11-21: 500 mL via INTRAVENOUS

## 2013-11-21 MED ORDER — SODIUM CHLORIDE 0.9 % IV SOLN
1000.0000 mL | Freq: Once | INTRAVENOUS | Status: AC
  Administered 2013-11-21: 1000 mL via INTRAVENOUS

## 2013-11-21 MED ORDER — PIPERACILLIN-TAZOBACTAM 3.375 G IVPB
3.3750 g | Freq: Once | INTRAVENOUS | Status: DC
  Filled 2013-11-21: qty 50

## 2013-11-21 MED ORDER — MUPIROCIN 2 % EX OINT
1.0000 "application " | TOPICAL_OINTMENT | Freq: Two times a day (BID) | CUTANEOUS | Status: AC
  Administered 2013-11-21 – 2013-11-26 (×10): 1 via NASAL
  Filled 2013-11-21 (×2): qty 22

## 2013-11-21 MED ORDER — HEPARIN SODIUM (PORCINE) 5000 UNIT/ML IJ SOLN
5000.0000 [IU] | Freq: Three times a day (TID) | INTRAMUSCULAR | Status: DC
  Administered 2013-11-21 – 2013-12-01 (×29): 5000 [IU] via SUBCUTANEOUS
  Filled 2013-11-21 (×32): qty 1

## 2013-11-21 MED ORDER — SODIUM CHLORIDE 0.9 % IV BOLUS (SEPSIS)
1000.0000 mL | Freq: Once | INTRAVENOUS | Status: AC
  Administered 2013-11-21: 1000 mL via INTRAVENOUS

## 2013-11-21 MED ORDER — VANCOMYCIN HCL IN DEXTROSE 1-5 GM/200ML-% IV SOLN
1000.0000 mg | Freq: Once | INTRAVENOUS | Status: AC
  Administered 2013-11-21: 1000 mg via INTRAVENOUS
  Filled 2013-11-21: qty 200

## 2013-11-21 MED ORDER — DEXTROSE-NACL 5-0.45 % IV SOLN
INTRAVENOUS | Status: DC
  Administered 2013-11-21: 75 mL/h via INTRAVENOUS

## 2013-11-21 MED ORDER — KCL IN DEXTROSE-NACL 20-5-0.45 MEQ/L-%-% IV SOLN
Freq: Once | INTRAVENOUS | Status: AC
  Administered 2013-11-21: 15:00:00 via INTRAVENOUS
  Filled 2013-11-21: qty 1000

## 2013-11-21 MED ORDER — PIPERACILLIN-TAZOBACTAM IN DEX 2-0.25 GM/50ML IV SOLN
2.2500 g | Freq: Three times a day (TID) | INTRAVENOUS | Status: DC
  Administered 2013-11-22: 2.25 g via INTRAVENOUS
  Filled 2013-11-21 (×3): qty 50

## 2013-11-21 MED ORDER — PANTOPRAZOLE SODIUM 40 MG IV SOLR
40.0000 mg | Freq: Every day | INTRAVENOUS | Status: DC
  Administered 2013-11-21: 40 mg via INTRAVENOUS
  Filled 2013-11-21 (×2): qty 40

## 2013-11-21 NOTE — H&P (Signed)
PULMONARY  / CRITICAL CARE MEDICINE HISTORY AND PHYSICAL EXAMINATION  Name: Micheal Goodman MRN: [id number] DOB: Jul 11, 1951    ADMISSION DATE:  11/21/2013  CHIEF COMPLAINT:  Hypoglycemia  BRIEF PATIENT DESCRIPTION: 63 yo male with diabetes admitted with hypoglycemia, hypothermia and acute on chronic renal failure.  SIGNIFICANT EVENTS / STUDIES:  1. CT Head 3/13 - moderate diffuse atrophy with mild periventricular disease 2. CXR 3/13 - Possible nodular density in left mid-lung  LINES / TUBES: 1. PIV  CULTURES: 1. Blood cx x 2 3/13 >>> 2. UA with reflex culture pending  ANTIBIOTICS: 1. Vancomycin 3/13 >>> 2. Zosyn 3/13 >>>  HISTORY OF PRESENT ILLNESS:   63 yo male with history of insulin-dependent diabetes who was admitted for hypoglycemia with initial blood glucose of 32. He and his brother report a 5-6 week history of illness that initially started with respiratory symptoms, but progressed to feelings of fatigue and feeling cold. He lives alone and his brother tried to contact him but was unable to so eventually went to his home and found him lying on the floor. He was found to have blood sugar of 32 and was given D50 with improvement in mental status. The patient does not know how he ended up on the floor and his minimal memory of today's events leading to hospital admission. He was also found to be hypothermic with temperature of 94 on arrival to ED and was given warm IV fluids. On evaluation, he was found to have Cr 7.4 and a follow was attempted but unable to be placed. Urology was consulted and a Foley was placed with 350 cc of clear yellow urine returned.  In terms of his diabetes, he reports his diabetes has been well-controlled with last Hgb A1c 6.1% in December and that he has never had hypoglycemic episodes such as this. Patient and his brother report intermittent episodes of gagging/vomiting with PO intake over the past several months and over the past several weeks seems to have  had very minimal PO intake. He notes some recent decrease in urine output, but denies any pain with urination, hematuria, dark urine or difficulty voiding. He otherwise has had BUE tremor and roving eye movements for several years of unknown etiology. He denies any recent fever, chest pain, SOB or abdominal pain.  PAST MEDICAL HISTORY :  Past Medical History  Diagnosis Date  . Hypertension   . Diabetes mellitus without complication   . Arthritis   . Wears glasses   . Anemia     Past Surgical History  Procedure Laterality Date  . Retinal detachment surgery  2004    left  . Skin split graft N/A 01/13/2013    Procedure: SPLIT THICKNESS SKIN GRAFT OF THE FOREHEAD;  Surgeon: Theodoro Kos, DO;  Location: [facility];  Service: Plastics;  Laterality: N/A;  Forehead;  Donor site right thigh. Split thickness skin graft    Prior to Admission medications   Medication Sig Start Date End Date Taking? Authorizing Provider  amLODipine (NORVASC) 5 MG tablet Take 5 mg by mouth daily.   Yes Historical Provider, MD  aspirin 81 MG tablet Take 81 mg by mouth daily.   Yes Historical Provider, MD  atorvastatin (LIPITOR) 20 MG tablet Take 20 mg by mouth daily.   Yes Historical Provider, MD  insulin glargine (LANTUS) 100 UNIT/ML injection Inject 10 Units into the skin daily.   Yes Historical Provider, MD  losartan (COZAAR) 50 MG tablet Take 50 mg by mouth daily.  Yes Historical Provider, MD  metFORMIN (GLUCOPHAGE) 1000 MG tablet Take 1,000 mg by mouth 2 (two) times daily with a meal.   Yes Historical Provider, MD  metoprolol succinate (TOPROL-XL) 50 MG 24 hr tablet Take 50 mg by mouth daily. Take with or immediately following a meal.   Yes Historical Provider, MD  Multiple Vitamins-Iron (MULTIVITAMINS WITH IRON) TABS Take 1 tablet by mouth daily.   Yes Historical Provider, MD  pioglitazone (ACTOS) 45 MG tablet Take 45 mg by mouth daily.   Yes Historical Provider, MD    Allergies  Allergen  Reactions  . Altace [Ramipril]     weakness    FAMILY HISTORY:  History reviewed. No pertinent family history.  SOCIAL HISTORY:  reports that he has never smoked. He does not have any smokeless tobacco history on file. He reports that he does not drink alcohol or use illicit drugs.  REVIEW OF SYSTEMS:  12 systems reviewed and negative except as per HPI.  PHYSICAL EXAM  VITAL SIGNS: Temp:  [94.4 F (34.7 C)-97.6 F (36.4 C)] 97.6 F (36.4 C) (03/13 1800) Pulse Rate:  [72-82] 76 (03/13 1900) Resp:  [14-20] 20 (03/13 1900) BP: (93-116)/(55-66) 97/55 mmHg (03/13 1900) SpO2:  [98 %-100 %] 100 % (03/13 1900) Weight:  [134 lb 11.2 oz (61.1 kg)-150 lb (68.04 kg)] 134 lb 11.2 oz (61.1 kg) (03/13 1800)  HEMODYNAMICS:    VENTILATOR SETTINGS:    INTAKE / OUTPUT: Intake/Output   None     PHYSICAL EXAMINATION: General:  Pale, appearing state age, no acute distress Neuro:  Awake, alert, oriented, BUE tremor more pronounced with activity, ?mild cogwheel rigidity and bradykinesia, ?dysdiadochokinesia, strength grossly intact HEENT:  PERRL, EOMI, roving involuntary eye movements Neck:  Supple, no lymphadenopathy Cardiovascular:  RRR, no murmurs/rubs/gallops, +2 distal pulses, no edema Lungs:  Clear to auscultation bilaterally, no wheezes/rales/rhonchi Abdomen:  +BS, soft, nontender, nondistended Musculoskeletal:  No clubbing or cyanosis Skin:  Intact, scattered actinic keratoses and scars from previous basal cell carcinoma removal on head and arms  LABS:  CBC Recent Labs     11/21/13  1350  WBC  5.4  HGB  8.5*  HCT  25.5*  PLT  126*    Coag's Recent Labs     11/21/13  1438  INR  0.95    BMET Recent Labs     11/21/13  1350  NA  136*  K  5.2  CL  98  CO2  15*  BUN  93*  CREATININE  7.40*  GLUCOSE  132*    Electrolytes Recent Labs     11/21/13  1350  CALCIUM  8.7    Sepsis Markers No results found for this basename: LACTICACIDVEN, PROCALCITON,  O2SATVEN,  in the last 72 hours  ABG Recent Labs     11/21/13  1616  PHART  7.326*  PCO2ART  23.4*  PO2ART  132.0*    Liver Enzymes Recent Labs     11/21/13  1350  AST  9  ALT  <5  ALKPHOS  45  BILITOT  0.2*  ALBUMIN  3.2*    Cardiac Enzymes Recent Labs     11/21/13  1350  TROPONINI  <0.30    Glucose Recent Labs     11/21/13  1439  11/21/13  1615  GLUCAP  88  86    Imaging Ct Head Wo Contrast  11/21/2013   CLINICAL DATA:  Altered mental status  EXAM: CT HEAD WITHOUT CONTRAST  TECHNIQUE: Contiguous axial  images were obtained from the base of the skull through the vertex without intravenous contrast. Study was obtained within 24 hr of patient's arrival at the emergency department.  COMPARISON:  None.  FINDINGS: There is moderate diffuse atrophy. There is no mass, hemorrhage, extra-axial fluid collection, or midline shift. There is mild small vessel disease in the centra semiovale bilaterally. Gray-white compartments elsewhere appear normal. There is no demonstrable acute infarct.  Bony calvarium appears intact. The mastoid air cells are clear. There is opacification of multiple ethmoid sinuses bilaterally. There is mild mucosal thickening in both maxillary antra.  IMPRESSION: Moderate diffuse atrophy with mild periventricular small vessel disease. No intracranial mass, hemorrhage or acute appearing infarct. Multifocal paranasal sinus disease bilaterally.   Electronically Signed   By: Lowella Grip M.D.   On: 11/21/2013 15:49   Dg Chest Port 1 View  11/21/2013   CLINICAL DATA:  Weakness  EXAM: PORTABLE CHEST - 1 VIEW  COMPARISON:  None.  FINDINGS: Cardiac shadow is within normal limits. The lungs are well aerated bilaterally. There is a questionable nodular density in the left mid lung superimposed over the anterior aspect of the third and fourth ribs. It measures approximately 17 mm. This may be related to overlying artifact. Short-term followup to assess for resolution is  recommended.  IMPRESSION: Questionable nodular density in the left mid lung. Short-term followup is recommended. If the lesion persists, nonemergent CT of the chest is recommended for further evaluation.   Electronically Signed   By: Inez Catalina M.D.   On: 11/21/2013 15:24     CXR: personally reviewed, no acute cardiopulmonary process noted, subtle L lung nodular opacity of unclear etiology  ASSESSMENT / PLAN: Principal Problem:   Acute renal failure Active Problems:   Anemia   Hypoglycemia   Hypothermia   PULMONARY A/P:  1. Possible L lung nodular opacity  Will require follow-up when acute process resolves with repeat CXR and possible CT Chest for further evaluation  CARDIOVASCULAR A/P:  1. Currently hemodynamically stable, s/p IVF, continue to monitor and can likely tolerate more IVF as needed. 2.   H/o hypertension - holding home medications for now and monitor given concern for possible sepsis   RENAL A/P:  1. Acute on chronic renal failure, metabolic acidosis: unclear etiology, possibly related to recent poor PO intake, possible obstructive component given history of urethral stricture. He does have history of chronic renal failure which may be related to his diabetes  Foley placed by urology as below  Continuing IVF with D5 1/2 NS   Monitor strict I/O and creatinine  Avoid nephrotoxins and renally dose medications  Check urine lytes  If creatinine does not improve back to baseline after Foley placement and with hydration, consider renal ultrasound and renal consult  2. Hyperkalemia  No intervention needed at this time, but continue to monitor closely in light of renal failure  3. Hypospadias with distal urethral stricture, unclear obstructive contribution to renal failure  Urology consulted and able to place Foley catheter  Will need to monitor for ability to void when Foley catheter discontinued  GASTROINTESTINAL A/P:  1. Gagging/vomiting episodes:  reports several month history with unclear etiology, continue to monitor closely for now. If diabetes well-controlled as reported then seems unlikely to be related to gastroparesis  PPI for possible GERD   HEMATOLOGIC A/P:   1. Anemia: decreased from previous CBC in 01/2013, no signs of bleeding noted and hemeoccult negative  Continue to monitor closely  Check iron studies,  ferritin, B12 and folate levels 2. DVT ppx with heparin   INFECTIOUS A/P: 1. Possible sepsis given hypothermia - no other signs/symptoms of infection at this time  S/p vanc and zosyn in ED, blood culture and UA with reflex drawn, will likely be able to de-escalate quickly  ENDOCRINE A/P: 1. Hypoglycemia and diabetes: hypoglycemia may be in the setting of taking all his medications with poor PO intake  Holding home insulin, pioglitazone and metformin for now  Continue D5 1/2 NS and monitor blood glucose q4  Advance diet as tolerated to carb controlled diet  Check HgbA1c with AM labs  NEUROLOGIC A/P: 1. Tremor, dysdiadochokinesia, possible cogwheel rigidity and bradykinesia: unclear etiology and has never had work-up  Would benefit from neurology evaluation when clinically improved, can likely be done as an outpatient  BEST PRACTICE / DISPOSITION Level of Care:  ICU Consultants:  Urology Code Status:  Full, discussed with patient Diet:  Advance as tolerated to carb modified diet DVT Px:  Heparin GI Px:  Pantoprazole Skin Integrity:  Intact Social / Family:  Brother updated at bedside    I have personally obtained a history, examined the patient, evaluated laboratory and imaging results, formulated the assessment and plan and placed orders.  CRITICAL CARE: The patient is critically ill with multiple organ systems failure and requires high complexity decision making for assessment and support, frequent evaluation and titration of therapies, application of advanced monitoring technologies and  extensive interpretation of multiple databases. Critical Care Time devoted to patient care services described in this note is 60 minutes.   Maricela Curet, MD [facility] [place] Pager: [phone]  11/21/2013, 8:40 PM

## 2013-11-21 NOTE — Progress Notes (Signed)
eLink Physician-Brief Progress Note Patient Name: Bryn Perkin DOB: February 10, 1951 MRN: [id number]  Date of Service  11/21/2013   HPI/Events of Note   Pt in acute renal failure and adm for same  eICU Interventions  See full pccm note to follow Renal consult   Intervention Category Major Interventions: Acute renal failure - evaluation and management  Asencion Noble 11/21/2013, 6:36 PM

## 2013-11-21 NOTE — Consult Note (Signed)
Urology Consult  Referring physician: Asencion Noble M.D. Reason for referral: Acute renal failure with questionable obstructive uropathy Inability to Pl., Foley catheter and anatomic abnormalities of the penis  History of Present Illness: 63 year old male admitted after being found unresponsive and hypoglycemic. He was noted to have acute renal failure with a creatinine of 7.4. Baseline renal function uncertain. The patient received fluid hydration but has not been able to void. He is only moderately uncomfortable. Multiple attempts at catheter placement in the emergency room were unsuccessful. Question of anatomic abnormalities. The patient denies any prior urologic history. He reports minimal nocturia or frequency. He reports an okay urinary stream with minimal hesitancy. He states a catheter was placed 10 years ago without difficulty. He does not recall any urologic instrumentation other than the Foley catheter placement on one occasion. He has had no recent dysuria or hematuria.  Past Medical History  Diagnosis Date  . Hypertension   . Diabetes mellitus without complication   . Arthritis   . Wears glasses   . Anemia    Past Surgical History  Procedure Laterality Date  . Retinal detachment surgery  2004    left  . Skin split graft N/A 01/13/2013    Procedure: SPLIT THICKNESS SKIN GRAFT OF THE FOREHEAD;  Surgeon: Theodoro Kos, DO;  Location: [facility];  Service: Plastics;  Laterality: N/A;  Forehead;  Donor site right thigh. Split thickness skin graft    Medications:  Scheduled: . heparin  5,000 Units Subcutaneous 3 times per day  . pantoprazole (PROTONIX) IV  40 mg Intravenous QHS  . [START ON 11/22/2013] piperacillin-tazobactam (ZOSYN)  IV  2.25 g Intravenous 3 times per day  . piperacillin-tazobactam (ZOSYN)  IV  3.375 g Intravenous Once    Allergies:  Allergies  Allergen Reactions  . Altace [Ramipril]     weakness    History reviewed. No pertinent family  history.  Social History:  reports that he has never smoked. He does not have any smokeless tobacco history on file. He reports that he does not drink alcohol or use illicit drugs.  ROS voiding symptoms as listed above. Currently denying any abdominal pain chest pain shortness of breath. He has had some chills.  Physical Exam:  Vital signs in last 24 hours: Temp:  [94.4 F (34.7 C)-97.6 F (36.4 C)] 97.6 F (36.4 C) (03/13 1800) Pulse Rate:  [72-82] 76 (03/13 1900) Resp:  [14-20] 20 (03/13 1900) BP: (93-116)/(55-66) 97/55 mmHg (03/13 1900) SpO2:  [98 %-100 %] 100 % (03/13 1900) Weight:  [134 lb 11.2 oz (61.1 kg)-150 lb (68.04 kg)] 134 lb 11.2 oz (61.1 kg) (03/13 1800)  Constitutional: Vital signs reviewed. WD WN in NAD Head: Normocephalic and atraumatic   Eyes: PERRL, No scleral icterus.  Neck: Supple No  Gross JVD Cardiovascular: RRR Pulmonary/Chest: Normal effort Abdominal: Soft. Non-tender, non-distended, bowel sounds are normal, no masses, organomegaly, or guarding present.  Genitourinary: Distal shaft hypospadias Extremities: No cyanosis or edema  Neurological: Grossly non-focal.  Skin: Warm,very dry and intact. No rash, cyanosis   Laboratory Data:  Results for orders placed during the hospital encounter of 11/21/13 (from the past 72 hour(s))  CBC WITH DIFFERENTIAL     Status: Abnormal   Collection Time    11/21/13  1:50 PM      Result Value Ref Range   WBC 5.4  4.0 - 10.5 K/uL   RBC 2.30 (*) 4.22 - 5.81 MIL/uL   Hemoglobin 8.5 (*) 13.0 - 17.0 g/dL  HCT 25.5 (*) 39.0 - 52.0 %   MCV 110.9 (*) 78.0 - 100.0 fL   MCH 37.0 (*) 26.0 - 34.0 pg   MCHC 33.3  30.0 - 36.0 g/dL   RDW 16.4 (*) 11.5 - 15.5 %   Platelets 126 (*) 150 - 400 K/uL   Neutrophils Relative % 86 (*) 43 - 77 %   Lymphocytes Relative 7 (*) 12 - 46 %   Monocytes Relative 7  3 - 12 %   Eosinophils Relative 0  0 - 5 %   Basophils Relative 0  0 - 1 %   Neutro Abs 4.6  1.7 - 7.7 K/uL   Lymphs Abs 0.4 (*)  0.7 - 4.0 K/uL   Monocytes Absolute 0.4  0.1 - 1.0 K/uL   Eosinophils Absolute 0.0  0.0 - 0.7 K/uL   Basophils Absolute 0.0  0.0 - 0.1 K/uL   Smear Review MORPHOLOGY UNREMARKABLE    COMPREHENSIVE METABOLIC PANEL     Status: Abnormal   Collection Time    11/21/13  1:50 PM      Result Value Ref Range   Sodium 136 (*) 137 - 147 mEq/L   Potassium 5.2  3.7 - 5.3 mEq/L   Chloride 98  96 - 112 mEq/L   CO2 15 (*) 19 - 32 mEq/L   Glucose, Bld 132 (*) 70 - 99 mg/dL   BUN 93 (*) 6 - 23 mg/dL   Creatinine, Ser 7.40 (*) 0.50 - 1.35 mg/dL   Calcium 8.7  8.4 - 10.5 mg/dL   Total Protein 6.2  6.0 - 8.3 g/dL   Albumin 3.2 (*) 3.5 - 5.2 g/dL   AST 9  0 - 37 U/L   ALT <5  0 - 53 U/L   Alkaline Phosphatase 45  39 - 117 U/L   Total Bilirubin 0.2 (*) 0.3 - 1.2 mg/dL   GFR calc non Af Amer 7 (*) >90 mL/min   GFR calc Af Amer 8 (*) >90 mL/min   Comment: (NOTE)     The eGFR has been calculated using the CKD EPI equation.     This calculation has not been validated in all clinical situations.     eGFR's persistently <90 mL/min signify possible Chronic Kidney     Disease.  ETHANOL     Status: None   Collection Time    11/21/13  1:50 PM      Result Value Ref Range   Alcohol, Ethyl (B) <11  0 - 11 mg/dL   Comment:            LOWEST DETECTABLE LIMIT FOR     SERUM ALCOHOL IS 11 mg/dL     FOR MEDICAL PURPOSES ONLY  TROPONIN I     Status: None   Collection Time    11/21/13  1:50 PM      Result Value Ref Range   Troponin I <0.30  <0.30 ng/mL   Comment:            Due to the release kinetics of cTnI,     a negative result within the first hours     of the onset of symptoms does not rule out     myocardial infarction with certainty.     If myocardial infarction is still suspected,     repeat the test at appropriate intervals.  CK     Status: None   Collection Time    11/21/13  1:50 PM  Result Value Ref Range   Total CK 37  7 - 232 U/L  I-STAT CG4 LACTIC ACID, ED     Status: Abnormal    Collection Time    11/21/13  2:07 PM      Result Value Ref Range   Lactic Acid, Venous 2.82 (*) 0.5 - 2.2 mmol/L  PROTIME-INR     Status: None   Collection Time    11/21/13  2:38 PM      Result Value Ref Range   Prothrombin Time 12.5  11.6 - 15.2 seconds   INR 0.95  0.00 - 1.49  CBG MONITORING, ED     Status: None   Collection Time    11/21/13  2:39 PM      Result Value Ref Range   Glucose-Capillary 88  70 - 99 mg/dL   Comment 1 Notify RN     Comment 2 Documented in Chart    CBG MONITORING, ED     Status: None   Collection Time    11/21/13  4:15 PM      Result Value Ref Range   Glucose-Capillary 86  70 - 99 mg/dL  I-STAT ARTERIAL BLOOD GAS, ED     Status: Abnormal   Collection Time    11/21/13  4:16 PM      Result Value Ref Range   pH, Arterial 7.326 (*) 7.350 - 7.450   pCO2 arterial 23.4 (*) 35.0 - 45.0 mmHg   pO2, Arterial 132.0 (*) 80.0 - 100.0 mmHg   Bicarbonate 12.5 (*) 20.0 - 24.0 mEq/L   TCO2 13  0 - 100 mmol/L   O2 Saturation 99.0     Acid-base deficit 13.0 (*) 0.0 - 2.0 mmol/L   Patient temperature 95.5 F     Collection site RADIAL, ALLEN'S TEST ACCEPTABLE     Drawn by Operator     Sample type ARTERIAL    OCCULT BLOOD X 1 CARD TO LAB, STOOL     Status: None   Collection Time    11/21/13  5:00 PM      Result Value Ref Range   Fecal Occult Bld NEGATIVE  NEGATIVE   No results found for this or any previous visit (from the past 240 hour(s)). Creatinine:  Recent Labs  11/21/13 1350  CREATININE 7.40*   Baseline Creatinine:   Impression/Assessment:  Clinical exam reveals distal shaft hypospadias. Initial attempts to place a catheter were unsuccessful. I used a filiform to place in the distal urethra and then using followers were able to dilate him from 14-20 [place]. I then placed a 16 French Foley catheter. We drained approximately 300-350 cc of clear dilute appearing urine  Plan:  Hypospadias with distal urethral stricture. A 16 French Foley placed and now  draining clear urine. Foley can be discontinued when no longer medically necessary. It remains unclear whether there is an obstructive component to his renal failure. Based on the volume obtained a catheter placement I would suspect he does not have an obstructive component but renal ultrasound can be considered if he does not have a significant diuresis and creatinine remains elevated.  Tasha Jindra S 11/21/2013, 7:49 PM

## 2013-11-21 NOTE — ED Notes (Signed)
Found lying in the floor by brother- EMS called and CBG 32 on scene.  After D50 CBG 158

## 2013-11-21 NOTE — ED Notes (Signed)
All medications given by this rn, not steve c., rt.

## 2013-11-21 NOTE — Progress Notes (Signed)
ANTIBIOTIC CONSULT NOTE - INITIAL  Pharmacy Consult:  Zosyn Indication:  Sepsis   Allergies  Allergen Reactions  . Altace [Ramipril]     weakness    Patient Measurements: Height: 5\' 10"  (177.8 cm) Weight: 134 lb 11.2 oz (61.1 kg) IBW/kg (Calculated) : 73  Vital Signs: Temp: 97.6 F (36.4 C) (03/13 1800) Temp src: Oral (03/13 1800) BP: 111/62 mmHg (03/13 1830) Pulse Rate: 79 (03/13 1830)  Labs:  Recent Labs  11/21/13 1350  WBC 5.4  HGB 8.5*  PLT 126*  CREATININE 7.40*   Estimated Creatinine Clearance: 9.1 ml/min (by C-G formula based on Cr of 7.4). No results found for this basename: VANCOTROUGH, VANCOPEAK, VANCORANDOM, GENTTROUGH, GENTPEAK, GENTRANDOM, TOBRATROUGH, TOBRAPEAK, TOBRARND, AMIKACINPEAK, AMIKACINTROU, AMIKACIN,  in the last 72 hours   Microbiology: No results found for this or any previous visit (from the past 720 hour(s)).  Medical History: Past Medical History  Diagnosis Date  . Hypertension   . Diabetes mellitus without complication   . Arthritis   . Wears glasses   . Anemia       Assessment: 33 YOM found by family unresponsive on the floor.  He had hypoglycemia with CBG of 32 at scene.  Pharmacy consulted to manage Zosyn for rule out sepsis.  Aware patient has AKI and first dose of Zosyn already ordered.   Goal of Therapy:  Infection prevention / Clearance of infection   Plan:  - Zosyn 2.25gm IV Q8H - Monitor renal fxn and adjust dosage as appropriate, abx de-escalation - F/U Renal and Urology consult    Cyanne Delmar D. Mina Marble, PharmD, BCPS Pager:  [phone] 11/21/2013, 7:03 PM

## 2013-11-21 NOTE — ED Notes (Signed)
Orange Juice provided 

## 2013-11-21 NOTE — ED Notes (Signed)
Urinal provided.  Family at bedside.

## 2013-11-21 NOTE — Progress Notes (Addendum)
Patient has not voided and has been given 4 liters of fluid.  Bladder scan done and revealed greater than 250ml of urine.  Assessed to place foley and urethra is posterior and very small.  MD notified.  Urology consulted.  Will continue to monitor.  Pt. Tried to void and only voided approximately 75ml.

## 2013-11-21 NOTE — ED Notes (Signed)
Attempted to place temp probe foley with Micheal Goodman EMT, pt urethra noted to be posterior to penis and halfway down shaft, very superficial. Unable to place 16 or 14 foley as urethra is small. Dr. Karle Starch consulted, states do not place foley at this time.

## 2013-11-21 NOTE — ED Provider Notes (Signed)
CSN: [id number]     Arrival date & time 11/21/13  1342 History   First MD Initiated Contact with Patient 11/21/13 1357     Chief Complaint  Patient presents with  . Hypoglycemia     (Consider location/radiation/quality/duration/timing/severity/associated sxs/prior Treatment) HPI 63 year old male with a history of insulin-dependent diabetes who was found on the floor with a blood sugar of 32. He was given D50 prehospital and became more alert. Recheck blood sugar was. Patient states that he did not really wake up this morning. He had not eaten since last night. He states he has intermittently had episodes of nausea and vomiting over several months. He denies that he has had any recent change in his medical status such as injury, headache, weakness, chest pain, fever, or diarrhea. He lives alone and his brother had tried to contact him but was unable. He apparently eventually came over and found him lying on the floor. Patient does not have any idea how he ended up on the floor. He denies any pain or injuries. Nursing had started to evaluate him before and alert and his body temperature was 94 rectally and he is been placed on a bear hugger and is being given warm IV fluids. Past Medical History  Diagnosis Date  . Hypertension   . Diabetes mellitus without complication   . Arthritis   . Wears glasses   . Anemia    Past Surgical History  Procedure Laterality Date  . Retinal detachment surgery  2004    left  . Skin split graft N/A 01/13/2013    Procedure: SPLIT THICKNESS SKIN GRAFT OF THE FOREHEAD;  Surgeon: Theodoro Kos, DO;  Location: [place];  Service: Plastics;  Laterality: N/A;  Forehead;  Donor site right thigh. Split thickness skin graft   No family history on file. History  Substance Use Topics  . Smoking status: Never Smoker   . Smokeless tobacco: Not on file  . Alcohol Use: No    Review of Systems  All other systems reviewed and are  negative.      Allergies  Altace  Home Medications   Current Outpatient Rx  Name  Route  Sig  Dispense  Refill  . insulin glargine (LANTUS) 100 UNIT/ML injection   Subcutaneous   Inject 10 Units into the skin daily.         Marland Kitchen amLODipine (NORVASC) 5 MG tablet   Oral   Take 5 mg by mouth daily.         Marland Kitchen aspirin 81 MG tablet   Oral   Take 81 mg by mouth daily.         Marland Kitchen atorvastatin (LIPITOR) 20 MG tablet   Oral   Take 20 mg by mouth daily.         Marland Kitchen losartan (COZAAR) 50 MG tablet   Oral   Take 50 mg by mouth daily.         . metFORMIN (GLUCOPHAGE) 1000 MG tablet   Oral   Take 1,000 mg by mouth 2 (two) times daily with a meal.         . metoprolol succinate (TOPROL-XL) 50 MG 24 hr tablet   Oral   Take 50 mg by mouth daily. Take with or immediately following a meal.         . Multiple Vitamins-Iron (MULTIVITAMINS WITH IRON) TABS   Oral   Take 1 tablet by mouth daily.         . pioglitazone (  ACTOS) 45 MG tablet   Oral   Take 45 mg by mouth daily.         Marland Kitchen UNKNOWN TO PATIENT   Oral   Take by mouth every morning. Pt taking antibiotic (not sure of name and left at home)          BP 93/60  Pulse 82  Temp(Src) 94.4 F (34.7 C) (Rectal)  Resp 18  Ht 5\' 10"  (1.778 m)  Wt 150 lb (68.04 kg)  BMI 21.52 kg/m2  SpO2 100% Physical Exam  Nursing note and vitals reviewed. Constitutional: He is oriented to person, place, and time. He appears well-developed and well-nourished.  Pale  HENT:  Head: Normocephalic and atraumatic.  Right Ear: External ear normal.  Left Ear: External ear normal.  Nose: Nose normal.  Mucous membranes dry  Eyes: EOM are normal. Pupils are equal, round, and reactive to light.  Roving eye movements at rest  Neck: Normal range of motion. Neck supple.  Cardiovascular: Normal rate, regular rhythm and normal heart sounds.   Pulmonary/Chest: Effort normal and breath sounds normal.  Abdominal: Soft. Bowel sounds are  normal. He exhibits no distension. There is no tenderness. There is no rebound.  Musculoskeletal: Normal range of motion. He exhibits no edema and no tenderness.  Neurological: He is alert and oriented to person, place, and time. He has normal reflexes. He displays normal reflexes. No cranial nerve deficit. He exhibits normal muscle tone. Coordination normal.  Skin: Skin is warm and dry.  Psychiatric: He has a normal mood and affect. His behavior is normal. Judgment and thought content normal.    ED Course  Procedures (including critical care time) Labs Review Labs Reviewed  CBC WITH DIFFERENTIAL - Abnormal; Notable for the following:    RBC 2.30 (*)    Hemoglobin 8.5 (*)    HCT 25.5 (*)    MCV 110.9 (*)    MCH 37.0 (*)    RDW 16.4 (*)    Platelets 126 (*)    Neutrophils Relative % 86 (*)    Lymphocytes Relative 7 (*)    Lymphs Abs 0.4 (*)    All other components within normal limits  COMPREHENSIVE METABOLIC PANEL - Abnormal; Notable for the following:    Sodium 136 (*)    CO2 15 (*)    Glucose, Bld 132 (*)    BUN 93 (*)    Creatinine, Ser 7.40 (*)    Albumin 3.2 (*)    Total Bilirubin 0.2 (*)    GFR calc non Af Amer 7 (*)    GFR calc Af Amer 8 (*)    All other components within normal limits  I-STAT CG4 LACTIC ACID, ED - Abnormal; Notable for the following:    Lactic Acid, Venous 2.82 (*)    All other components within normal limits  CULTURE, BLOOD (ROUTINE X 2)  CULTURE, BLOOD (ROUTINE X 2)  ETHANOL  TROPONIN I  CK  PROTIME-INR  URINALYSIS, ROUTINE W REFLEX MICROSCOPIC  URINE RAPID DRUG SCREEN (HOSP PERFORMED)  OCCULT BLOOD X 1 CARD TO LAB, STOOL  CBG MONITORING, ED   Imaging Review Ct Head Wo Contrast  11/21/2013   CLINICAL DATA:  Altered mental status  EXAM: CT HEAD WITHOUT CONTRAST  TECHNIQUE: Contiguous axial images were obtained from the base of the skull through the vertex without intravenous contrast. Study was obtained within 24 hr of patient's arrival at  the emergency department.  COMPARISON:  None.  FINDINGS: There is moderate  diffuse atrophy. There is no mass, hemorrhage, extra-axial fluid collection, or midline shift. There is mild small vessel disease in the centra semiovale bilaterally. Gray-white compartments elsewhere appear normal. There is no demonstrable acute infarct.  Bony calvarium appears intact. The mastoid air cells are clear. There is opacification of multiple ethmoid sinuses bilaterally. There is mild mucosal thickening in both maxillary antra.  IMPRESSION: Moderate diffuse atrophy with mild periventricular small vessel disease. No intracranial mass, hemorrhage or acute appearing infarct. Multifocal paranasal sinus disease bilaterally.   Electronically Signed   By: Lowella Grip M.D.   On: 11/21/2013 15:49   Dg Chest Port 1 View  11/21/2013   CLINICAL DATA:  Weakness  EXAM: PORTABLE CHEST - 1 VIEW  COMPARISON:  None.  FINDINGS: Cardiac shadow is within normal limits. The lungs are well aerated bilaterally. There is a questionable nodular density in the left mid lung superimposed over the anterior aspect of the third and fourth ribs. It measures approximately 17 mm. This may be related to overlying artifact. Short-term followup to assess for resolution is recommended.  IMPRESSION: Questionable nodular density in the left mid lung. Short-term followup is recommended. If the lesion persists, nonemergent CT of the chest is recommended for further evaluation.   Electronically Signed   By: Inez Catalina M.D.   On: 11/21/2013 15:24     EKG Interpretation   Date/Time:  Friday November 21 2013 13:59:19 EDT Ventricular Rate:  76 PR Interval:  120 QRS Duration: 72 QT Interval:  408 QTC Calculation: 459 R Axis:   67 Text Interpretation:  Normal sinus rhythm Possible Left atrial enlargement  Nonspecific ST abnormality Abnormal ECG Confirmed by Patton Swisher MD, Andee Poles  [id number]) on 11/21/2013 2:30:03 PM      MDM   Final diagnoses:  None     63 year old male with insulin-dependent diabetes presents with hypoglycemia and generalized weakness. Patient with lab workup here that reveals new onset renal failure. He is hypothermic with initial body temperature of 94 and is being rewarmed with bear hugger and warm IV fluids. Hypoglycemia has required parenteral glucose on repeat occasions. Given his hypothermia he will have blood cultures obtained. He will receive additional coverage for sepsis with Zosyn and vancomycin. Chest x-Kimberlee Shoun shows a left mid lung nodule which could represent pneumonia he has not made urine yet here. Blood pressures have ranged from [id number] systolically increasing with IV fluid hydration.   1- new onset renal failure-  Patient not making urine here and iv fluid hydration ensuing.  We will attempt foley placement to assess for post obstructive etiology and also place temp probe.   2- hypoglycemia- patient with iv d5 infusing and will recheck bs.   3- anemia- hemoglobin 8.  Hemocult ordered.  Patient currently not tachycardia and doubt current bp due to low hemoglobin- no obvious blood loss noted.  4- lung infiltrate-  Patient with left lung density.   5- ?sepsis- given hypothermia, cannot rule out sepsis although hypoglycemia may account for hypothermia independently.   6- hypothermia- active rewarming ensuing.   CRITICAL CARE Performed by: Shaune Pollack Total critical care time: 60 Critical care time was exclusive of separately billable procedures and treating other patients. Critical care was necessary to treat or prevent imminent or life-threatening deterioration. Critical care was time spent personally by me on the following activities: development of treatment plan with patient and/or surrogate as well as nursing, discussions with consultants, evaluation of patient's response to treatment, examination of patient, obtaining history from patient or  surrogate, ordering and performing treatments and  interventions, ordering and review of laboratory studies, ordering and review of radiographic studies, pulse oximetry and re-evaluation of patient's condition.   Discussed with Dr. Joya Gaskins and patient being transferred to [facility] to ICU.    Shaune Pollack, MD 11/21/13 [phone]

## 2013-11-21 NOTE — ED Notes (Signed)
Patient transported to CT 

## 2013-11-21 NOTE — ED Notes (Signed)
CBG 112.  

## 2013-11-22 ENCOUNTER — Inpatient Hospital Stay (HOSPITAL_COMMUNITY): Payer: BC Managed Care – PPO

## 2013-11-22 DIAGNOSIS — E162 Hypoglycemia, unspecified: Secondary | ICD-10-CM

## 2013-11-22 DIAGNOSIS — T68XXXA Hypothermia, initial encounter: Secondary | ICD-10-CM

## 2013-11-22 LAB — CBC
HCT: 24.4 % — ABNORMAL LOW (ref 39.0–52.0)
HEMATOCRIT: 19.5 % — AB (ref 39.0–52.0)
Hemoglobin: 6.7 g/dL — CL (ref 13.0–17.0)
Hemoglobin: 8.6 g/dL — ABNORMAL LOW (ref 13.0–17.0)
MCH: 36.8 pg — ABNORMAL HIGH (ref 26.0–34.0)
MCH: 36.6 pg — ABNORMAL HIGH (ref 26.0–34.0)
MCHC: 34.4 g/dL (ref 30.0–36.0)
MCHC: 35.2 g/dL (ref 30.0–36.0)
MCV: 107.1 fL — AB (ref 78.0–100.0)
MCV: 103.8 fL — AB (ref 78.0–100.0)
PLATELETS: 85 10*3/uL — AB (ref 150–400)
Platelets: 85 10*3/uL — ABNORMAL LOW (ref 150–400)
RBC: 1.82 MIL/uL — AB (ref 4.22–5.81)
RBC: 2.35 MIL/uL — AB (ref 4.22–5.81)
RDW: 17.4 % — ABNORMAL HIGH (ref 11.5–15.5)
RDW: 19.2 % — ABNORMAL HIGH (ref 11.5–15.5)
WBC: 3 10*3/uL — AB (ref 4.0–10.5)
WBC: 2.9 10*3/uL — AB (ref 4.0–10.5)

## 2013-11-22 LAB — URINALYSIS, ROUTINE W REFLEX MICROSCOPIC
BILIRUBIN URINE: NEGATIVE
Glucose, UA: >1000 mg/dL — AB
KETONES UR: NEGATIVE mg/dL
NITRITE: NEGATIVE
PH: 5 (ref 5.0–8.0)
Protein, ur: NEGATIVE mg/dL
Specific Gravity, Urine: 1.015 (ref 1.005–1.030)
Urobilinogen, UA: 0.2 mg/dL (ref 0.0–1.0)

## 2013-11-22 LAB — BASIC METABOLIC PANEL
BUN: 76 mg/dL — AB (ref 6–23)
BUN: 77 mg/dL — ABNORMAL HIGH (ref 6–23)
BUN: 73 mg/dL — ABNORMAL HIGH (ref 6–23)
CALCIUM: 7.5 mg/dL — AB (ref 8.4–10.5)
CHLORIDE: 106 meq/L (ref 96–112)
CO2: 11 meq/L — AB (ref 19–32)
CO2: 13 mEq/L — ABNORMAL LOW (ref 19–32)
CO2: 14 meq/L — AB (ref 19–32)
Calcium: 7.3 mg/dL — ABNORMAL LOW (ref 8.4–10.5)
Calcium: 7.3 mg/dL — ABNORMAL LOW (ref 8.4–10.5)
Chloride: 106 mEq/L (ref 96–112)
Chloride: 101 mEq/L (ref 96–112)
Creatinine, Ser: 6.19 mg/dL — ABNORMAL HIGH (ref 0.50–1.35)
Creatinine, Ser: 6.28 mg/dL — ABNORMAL HIGH (ref 0.50–1.35)
Creatinine, Ser: 6.18 mg/dL — ABNORMAL HIGH (ref 0.50–1.35)
GFR calc Af Amer: 10 mL/min — ABNORMAL LOW (ref >90)
GFR calc Af Amer: 10 mL/min — ABNORMAL LOW (ref >90)
GFR calc Af Amer: 10 mL/min — ABNORMAL LOW (ref >90)
GFR calc non Af Amer: 9 mL/min — ABNORMAL LOW (ref >90)
GFR calc non Af Amer: 9 mL/min — ABNORMAL LOW (ref >90)
GFR, EST NON AFRICAN AMERICAN: 9 mL/min — AB (ref >90)
GLUCOSE: 118 mg/dL — AB (ref 70–99)
Glucose, Bld: 117 mg/dL — ABNORMAL HIGH (ref 70–99)
Glucose, Bld: 338 mg/dL — ABNORMAL HIGH (ref 70–99)
POTASSIUM: 5.4 meq/L — AB (ref 3.7–5.3)
POTASSIUM: 4.8 meq/L (ref 3.7–5.3)
Potassium: 5 mEq/L (ref 3.7–5.3)
SODIUM: 135 meq/L — AB (ref 137–147)
SODIUM: 132 meq/L — AB (ref 137–147)
Sodium: 136 mEq/L — ABNORMAL LOW (ref 137–147)

## 2013-11-22 LAB — GLUCOSE, CAPILLARY
GLUCOSE-CAPILLARY: 144 mg/dL — AB (ref 70–99)
Glucose-Capillary: 114 mg/dL — ABNORMAL HIGH (ref 70–99)

## 2013-11-22 LAB — URINE MICROSCOPIC-ADD ON

## 2013-11-22 LAB — FERRITIN: Ferritin: 129 ng/mL (ref 22–322)

## 2013-11-22 LAB — DIC (DISSEMINATED INTRAVASCULAR COAGULATION)PANEL
D-Dimer, Quant: 1.71 ug/mL-FEU — ABNORMAL HIGH (ref 0.00–0.48)
INR: 1.1 (ref 0.00–1.49)
Platelets: 76 10*3/uL — ABNORMAL LOW (ref 150–400)
Smear Review: NONE SEEN
aPTT: 31 seconds (ref 24–37)

## 2013-11-22 LAB — IRON AND TIBC
IRON: 56 ug/dL (ref 42–135)
Saturation Ratios: 30 % (ref 20–55)
TIBC: 185 ug/dL — AB (ref 215–435)
UIBC: 129 ug/dL (ref 125–400)

## 2013-11-22 LAB — ABO/RH: ABO/RH(D): A POS

## 2013-11-22 LAB — PREPARE RBC (CROSSMATCH)

## 2013-11-22 LAB — DIC (DISSEMINATED INTRAVASCULAR COAGULATION) PANEL
FIBRINOGEN: 373 mg/dL (ref 204–475)
PROTHROMBIN TIME: 14 s (ref 11.6–15.2)

## 2013-11-22 LAB — VITAMIN B12: Vitamin B-12: 146 pg/mL — ABNORMAL LOW (ref 211–911)

## 2013-11-22 MED ORDER — DEXTROSE-NACL 5-0.9 % IV SOLN
INTRAVENOUS | Status: DC
  Administered 2013-11-22: 75 mL via INTRAVENOUS

## 2013-11-22 MED ORDER — SODIUM CHLORIDE 0.9 % IV BOLUS (SEPSIS)
500.0000 mL | Freq: Once | INTRAVENOUS | Status: AC
  Administered 2013-11-22: 500 mL via INTRAVENOUS

## 2013-11-22 MED ORDER — DEXTROSE 5 % IV SOLN
INTRAVENOUS | Status: DC
  Administered 2013-11-22: 11:00:00 via INTRAVENOUS
  Filled 2013-11-22 (×3): qty 150

## 2013-11-22 MED ORDER — PANTOPRAZOLE SODIUM 40 MG PO TBEC
40.0000 mg | DELAYED_RELEASE_TABLET | Freq: Every day | ORAL | Status: DC
  Administered 2013-11-22 – 2013-12-01 (×10): 40 mg via ORAL
  Filled 2013-11-22 (×10): qty 1

## 2013-11-22 NOTE — Consult Note (Signed)
Micheal Goodman is an 63 y.o. male referred by Dr SOOD   Chief Complaint: acute renal failure HPI: 63yo WM transferred from [facility] after presenting there with syncope and hypoglycemia.  Pt denies any history of renal insufficiency though Scr 5/14 was 1.5.  On presentation Scr was 7.4 and today Scr 6.28.  Denies gross hematuria, stones, obs sxs or use of NSAID's.  Admits to 2wk or so hx of feeling weak and achy with decreased PO intake.  He was on losartan and metformin PTA.  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 He has been hypotensive since admission.  Past Medical History  Diagnosis Date  . Hypertension   . Diabetes mellitus without complication   . Arthritis   . Wears glasses   . Anemia   . Basal cell carcinoma     Past Surgical History  Procedure Laterality Date  . Retinal detachment surgery  2004    left  . Skin split graft N/A 01/13/2013    Procedure: SPLIT THICKNESS SKIN GRAFT OF THE FOREHEAD;  Surgeon: Theodoro Kos, DO;  Location: [place];  Service: Plastics;  Laterality: N/A;  Forehead;  Donor site right thigh. Split thickness skin graft    History reviewed. No pertinent family history. Social History:  reports that he has never smoked. He does not have any smokeless tobacco history on file. He reports that he does not drink alcohol or use illicit drugs.  Allergies:  Allergies  Allergen Reactions  . Altace [Ramipril]     weakness    Medications Prior to Admission  Medication Sig Dispense Refill  . amLODipine (NORVASC) 5 MG tablet Take 5 mg by mouth daily.      Marland Kitchen aspirin 81 MG tablet Take 81 mg by mouth daily.      Marland Kitchen atorvastatin (LIPITOR) 20 MG tablet Take 20 mg by mouth daily.      . insulin glargine (LANTUS) 100 UNIT/ML injection Inject 10 Units into the skin daily.      Marland Kitchen losartan (COZAAR) 50 MG tablet Take 50 mg by mouth daily.      . metFORMIN (GLUCOPHAGE) 1000 MG tablet Take 1,000 mg by mouth 2 (two) times daily  with a meal.      . metoprolol succinate (TOPROL-XL) 50 MG 24 hr tablet Take 50 mg by mouth daily. Take with or immediately following a meal.      . Multiple Vitamins-Iron (MULTIVITAMINS WITH IRON) TABS Take 1 tablet by mouth daily.      . pioglitazone (ACTOS) 45 MG tablet Take 45 mg by mouth daily.         Lab Results: UA:  3-6wbc, 3-6rbc no protein   Recent Labs  11/21/13 1350 11/22/13 0340  WBC 5.4 3.0*  HGB 8.5* 6.7*  HCT 25.5* 19.5*  PLT 126* 85*   BMET  Recent Labs  11/21/13 1350 11/21/13 2210 11/22/13 0340  NA 136* 136* 135*  K 5.2 5.4* 5.0  CL 98 106 106  CO2 15* 11* 13*  GLUCOSE 132* 118* 117*  BUN 93* 76* 77*  CREATININE 7.40* 6.19* 6.28*  CALCIUM 8.7 7.3* 7.3*   LFT  Recent Labs  11/21/13 1350  PROT 6.2  ALBUMIN 3.2*  AST 9  ALT <5  ALKPHOS 45  BILITOT 0.2*   Ct Head Wo Contrast  11/21/2013   CLINICAL DATA:  Altered mental status  EXAM: CT HEAD WITHOUT CONTRAST  TECHNIQUE: Contiguous axial images were obtained from  the base of the skull through the vertex without intravenous contrast. Study was obtained within 24 hr of patient's arrival at the emergency department.  COMPARISON:  None.  FINDINGS: There is moderate diffuse atrophy. There is no mass, hemorrhage, extra-axial fluid collection, or midline shift. There is mild small vessel disease in the centra semiovale bilaterally. Gray-white compartments elsewhere appear normal. There is no demonstrable acute infarct.  Bony calvarium appears intact. The mastoid air cells are clear. There is opacification of multiple ethmoid sinuses bilaterally. There is mild mucosal thickening in both maxillary antra.  IMPRESSION: Moderate diffuse atrophy with mild periventricular small vessel disease. No intracranial mass, hemorrhage or acute appearing infarct. Multifocal paranasal sinus disease bilaterally.   Electronically Signed   By: Lowella Grip M.D.   On: 11/21/2013 15:49   Dg Chest Port 1 View  11/22/2013    CLINICAL DATA:  Atelectasis  EXAM: PORTABLE CHEST - 1 VIEW  COMPARISON:  Chest x-ray from yesterday  FINDINGS: The previously reported nodular density in the left mid lung faintly persists. It is ovoid, with the largest dimension measuring 51mm. There is no edema or lobar consolidation. No evidence of effusion (as permitted by exclusion of lateral costophrenic sulci sign) or pneumothorax. Normal heart size.  IMPRESSION: Faintly persistent 2 cm nodule in the left chest. Chest CT followup is recommended to exclude a nodule. Preferably, this would be accomplished on an outpatient basis - when there has been time for clearing of any inflammatory or infectious infiltrates.   Electronically Signed   By: Jorje Guild M.D.   On: 11/22/2013 08:17   Dg Chest Port 1 View  11/21/2013   CLINICAL DATA:  Weakness  EXAM: PORTABLE CHEST - 1 VIEW  COMPARISON:  None.  FINDINGS: Cardiac shadow is within normal limits. The lungs are well aerated bilaterally. There is a questionable nodular density in the left mid lung superimposed over the anterior aspect of the third and fourth ribs. It measures approximately 17 mm. This may be related to overlying artifact. Short-term followup to assess for resolution is recommended.  IMPRESSION: Questionable nodular density in the left mid lung. Short-term followup is recommended. If the lesion persists, nonemergent CT of the chest is recommended for further evaluation.   Electronically Signed   By: Inez Catalina M.D.   On: 11/21/2013 15:24    ROS: No change in vision No SOB No CP No abd pain.  No diarrhea though has 1 loose stool/d No arthritic CO No neuropathic CO No dysuria  PHYSICAL EXAM: Blood pressure 83/46, pulse 88, temperature 98.1 F (36.7 C), temperature source Oral, resp. rate 15, height $RemoveBe'5\' 10"'ZCoIeqSBb$  (1.778 m), weight 61.8 kg (136 lb 3.9 oz), SpO2 100.00%. HEENT: PERRLA EOMI + nystagmus NECK:No JVD LUNGS:clear CARDIAC:RRR wo MRG ABD:+ BS NTND No HSM EXT:No edema Pulses:  2/4 = bil NEURO:CNI M&SI Ox3 no asterixis Skin: no open lesions  No rash  Assessment: 1. ARF most likely sec to hypotension on ARB. His PLT ct is low, however, and raises suspicion for TTP.   2.  Hypotension, ? Sepsis.  Cultures pending and on empiric AB 3. Pancytopenia 4. Met acidosis sec ARF and Metformin PLAN: 1. Check renal US 2. Bicarb gtt as you have ordered 3. Peripheral blood smear and ADAMTS activity 4. Hold losartan and metformin 5. SPEP 6. Renal diet 7. Agree with checking cortisol 8. Await culture results 9. Iron studies as you have ordered 10. Daily Scr and CBC   Rolan Wrightsman T 11/22/2013, 10:37 AM

## 2013-11-22 NOTE — Progress Notes (Signed)
[place] Progress Note Patient Name: Micheal Goodman DOB: [birth year] MRN: [id number]  Date of Service  11/22/2013   HPI/Events of Note  Hypotension with current BP of 94/46 (59).  Also with Na of 136 on last lab on D5 1/2 NS at 75 cc/hr   eICU Interventions  Plan; 500 cc NS bolus for BP support Change IVFs to D5NS at 75 cc/hr Monitor BP and resp status given AoCRF   Intervention Category Intermediate Interventions: Hypotension - evaluation and management  DETERDING,ELIZABETH 11/22/2013, 1:06 AM

## 2013-11-22 NOTE — Progress Notes (Signed)
[place] Progress Note Patient Name: Micheal Goodman DOB: 08-21-1951 MRN: [id number]  Date of Service  11/22/2013   HPI/Events of Note  Hgb of 6.7 down from 8.5.  Received 4 liters of fluid yesterday   eICU Interventions  Plan: Transfuse 1 u pRBC Post-transfusion CBC   Intervention Category Intermediate Interventions: Bleeding - evaluation and treatment with blood products  [name] 11/22/2013, 5:07 AM

## 2013-11-22 NOTE — Progress Notes (Addendum)
PULMONARY / CRITICAL CARE MEDICINE   Name: Micheal Goodman MRN: [id number] DOB: 17-Apr-1951    ADMISSION DATE:  11/21/2013 CONSULTATION DATE:  11/21/2013  REFERRING MD : Pattricia Boss PRIMARY CARE : Kathryne Eriksson  CHIEF COMPLAINT: Hypoglycemia  BRIEF PATIENT DESCRIPTION:  63 yo male with hx of IDDM felt ill for several weeks prior to admission, and found on floor by family.  Brought to ER and found to have blood sugar of 32, hypothermic, and AKI.  PCCM asked to admit to ICU.  SIGNIFICANT EVENTS: 3/13 Admit to ICU, urology consulted for hypospadias 3/14 Transfuse PRBC  STUDIES:  3/13 CT head >> moderate diffuse atrophy with mild periventricular small vessel  disease.  LINES / TUBES: PIV  CULTURES: Blood 3/13 >>  ANTIBIOTICS: Vancomycin 3/13 >> Zosyn 3/13 >>   SUBJECTIVE:  He denies headache, dizziness, vertigo, difficulty with speech, weakness, numbness, chest pain, abdominal pain, or dyspnea.  VITAL SIGNS: Temp:  [94.4 F (34.7 C)-99.7 F (37.6 C)] 98.5 F (36.9 C) (03/14 0715) Pulse Rate:  [72-83] 79 (03/14 0715) Resp:  [13-33] 33 (03/14 0715) BP: (79-116)/(40-66) 90/48 mmHg (03/14 0715) SpO2:  [97 %-100 %] 99 % (03/14 0715) Weight:  [134 lb 11.2 oz (61.1 kg)-150 lb (68.04 kg)] 136 lb 3.9 oz (61.8 kg) (03/14 0500) INTAKE / OUTPUT: Intake/Output     03/13 0701 - 03/14 0700 03/14 0701 - 03/15 0700   P.O. 120    I.V. (mL/kg) 987.5 (16)    Total Intake(mL/kg) 1107.5 (17.9)    Urine (mL/kg/hr) 625    Total Output 625     Net +482.5            PHYSICAL EXAMINATION: General: chronically ill appearing Neuro:  Alert, normal strength, follows commands, moves extremities, intention tremor HEENT:  Horizontal nystagmus, pupils reactive, no sinus tenderness Cardiovascular:  Regular, no murmur Lungs:  No wheeze Abdomen:  Soft, non tender Musculoskeletal:  1+ edema Skin:  Onychomycosis of lower extremities  LABS:  CBC  Recent Labs Lab 11/21/13 1350 11/22/13 0340   WBC 5.4 3.0*  HGB 8.5* 6.7*  HCT 25.5* 19.5*  PLT 126* 85*   Coag's  Recent Labs Lab 11/21/13 1438  INR 0.95   BMET  Recent Labs Lab 11/21/13 1350 11/21/13 2210 11/22/13 0340  NA 136* 136* 135*  K 5.2 5.4* 5.0  CL 98 106 106  CO2 15* 11* 13*  BUN 93* 76* 77*  CREATININE 7.40* 6.19* 6.28*  GLUCOSE 132* 118* 117*   Electrolytes  Recent Labs Lab 11/21/13 1350 11/21/13 2210 11/22/13 0340  CALCIUM 8.7 7.3* 7.3*   Sepsis Markers  Recent Labs Lab 11/21/13 1407  LATICACIDVEN 2.82*   ABG  Recent Labs Lab 11/21/13 1616  PHART 7.326*  PCO2ART 23.4*  PO2ART 132.0*   Liver Enzymes  Recent Labs Lab 11/21/13 1350  AST 9  ALT <5  ALKPHOS 45  BILITOT 0.2*  ALBUMIN 3.2*   Cardiac Enzymes  Recent Labs Lab 11/21/13 1350  TROPONINI <0.30   Glucose  Recent Labs Lab 11/21/13 1439 11/21/13 1615 11/21/13 1813 11/21/13 2330 11/22/13 0349  GLUCAP 88 86 125* 144* 114*    Imaging Ct Head Wo Contrast  11/21/2013   CLINICAL DATA:  Altered mental status  EXAM: CT HEAD WITHOUT CONTRAST  TECHNIQUE: Contiguous axial images were obtained from the base of the skull through the vertex without intravenous contrast. Study was obtained within 24 hr of patient's arrival at the emergency department.  COMPARISON:  None.  FINDINGS: There  is moderate diffuse atrophy. There is no mass, hemorrhage, extra-axial fluid collection, or midline shift. There is mild small vessel disease in the centra semiovale bilaterally. Gray-white compartments elsewhere appear normal. There is no demonstrable acute infarct.  Bony calvarium appears intact. The mastoid air cells are clear. There is opacification of multiple ethmoid sinuses bilaterally. There is mild mucosal thickening in both maxillary antra.  IMPRESSION: Moderate diffuse atrophy with mild periventricular small vessel disease. No intracranial mass, hemorrhage or acute appearing infarct. Multifocal paranasal sinus disease bilaterally.    Electronically Signed   By: Lowella Grip M.D.   On: 11/21/2013 15:49   Dg Chest Port 1 View  11/22/2013   CLINICAL DATA:  Atelectasis  EXAM: PORTABLE CHEST - 1 VIEW  COMPARISON:  Chest x-ray from yesterday  FINDINGS: The previously reported nodular density in the left mid lung faintly persists. It is ovoid, with the largest dimension measuring 43mm. There is no edema or lobar consolidation. No evidence of effusion (as permitted by exclusion of lateral costophrenic sulci sign) or pneumothorax. Normal heart size.  IMPRESSION: Faintly persistent 2 cm nodule in the left chest. Chest CT followup is recommended to exclude a nodule. Preferably, this would be accomplished on an outpatient basis - when there has been time for clearing of any inflammatory or infectious infiltrates.   Electronically Signed   By: Jorje Guild M.D.   On: 11/22/2013 08:17   Dg Chest Port 1 View  11/21/2013   CLINICAL DATA:  Weakness  EXAM: PORTABLE CHEST - 1 VIEW  COMPARISON:  None.  FINDINGS: Cardiac shadow is within normal limits. The lungs are well aerated bilaterally. There is a questionable nodular density in the left mid lung superimposed over the anterior aspect of the third and fourth ribs. It measures approximately 17 mm. This may be related to overlying artifact. Short-term followup to assess for resolution is recommended.  IMPRESSION: Questionable nodular density in the left mid lung. Short-term followup is recommended. If the lesion persists, nonemergent CT of the chest is recommended for further evaluation.   Electronically Signed   By: Inez Catalina M.D.   On: 11/21/2013 15:24   ASSESSMENT / PLAN:  PULMONARY A: Lt lung pulmonary nodule. P:   Will need f/u CT chest as outpt to further assess  CARDIOVASCULAR A:  Hypotension likely from hypovolemia. Hx of HTN, hyperlipidemia. P:  Continue volume replenishment Monitor hemodynamics Hold outpt ASA, norvasc, lipitor, cozaar, toprol for now  RENAL A:    Acute kidney injury likely from hypovolemia and possible post-obstructive uropathy with hypospadias >> baseline creatinine 1.44 from 01/10/13. Metabolic acidosis >> from AKI and metformin use >> improving. Hyperkalemia >> improved. Hypospadias. P:   Foley per urology Monitor renal fx, urine outpt, electrolytes Nephrology consulted Change IV fluids to D5W with 3 amps HCO3 on 3/14 Renal u/s if renal fx fails to improve further  GASTROINTESTINAL A:   ?DM gastroparesis as cause of pt c/o difficulty eating. P:   Continue diet Protonix for SUP  HEMATOLOGIC A:   Anemia of critical illness and chronic disease >> no evidence for bleeding. P:  F/u CBC Transfuse for Hb < 7 SQ heparin for DVT prevention F/u Anemia panel from 3/13  INFECTIOUS A:   ?of sepsis on admission >> no obvious source of infection. P:   Will d/c Abx 3/14 and monitor clinically  ENDOCRINE A:   Hypoglycemia on admission. Hx of Diabetes mellitus.   P:   SSI with close monitoring of blood sugars  F/u cortisol and TSH Hold outpt metformin, actos F/u HbA1C from 3/13  NEUROLOGIC A:   Tremor, nystagmus >> pt reports these are chronic. Deconditioning. P:   Will need neurology evaluation at some point Will need PT/OT assessment when more stable  Will transfer to SDU.  Will ask [facility] to assume care from 3/15 and PCCM sign off.  Chesley Mires, MD [facility] Pulmonary/Critical Care 11/22/2013, 9:01 AM Pager:  [phone] After 3pm call: [phone]

## 2013-11-22 NOTE — Progress Notes (Signed)
Subjective: Patient no problems with Foley catheter. It is draining clear urine. Output borderline creatinine has improved minimally.  Objective: Vital signs in last 24 hours: Temp:  [94.4 F (34.7 C)-99.7 F (37.6 C)] 98.2 F (36.8 C) (03/14 1200) Pulse Rate:  [72-96] 96 (03/14 1300) Resp:  [13-33] 15 (03/14 1300) BP: (75-145)/(40-81) 145/81 mmHg (03/14 1300) SpO2:  [97 %-100 %] 99 % (03/14 1300) Weight:  [134 lb 11.2 oz (61.1 kg)-150 lb (68.04 kg)] 136 lb 3.9 oz (61.8 kg) (03/14 0500)  Intake/Output from previous day: 03/13 0701 - 03/14 0700 In: 1107.5 [P.O.:120; I.V.:987.5] Out: 625 [Urine:625] Intake/Output this shift: Total I/O In: 600 [I.V.:300; Blood:300] Out: 225 [Urine:225]  Physical Exam:  Constitutional: Vital signs reviewed. WD WN in NAD   Eyes: PERRL, No scleral icterus.   Cardiovascular: RRR Pulmonary/Chest: Normal effort Abdominal: Soft. Non-tender, non-distended, bowel sounds are normal, no masses, organomegaly, or guarding present.  Genitourinary: Extremities: No cyanosis or edema   Lab Results:  Recent Labs  11/21/13 1350 11/22/13 0340  HGB 8.5* 6.7*  HCT 25.5* 19.5*   BMET  Recent Labs  11/21/13 2210 11/22/13 0340  NA 136* 135*  K 5.4* 5.0  CL 106 106  CO2 11* 13*  GLUCOSE 118* 117*  BUN 76* 77*  CREATININE 6.19* 6.28*  CALCIUM 7.3* 7.3*    Recent Labs  11/21/13 1438 11/22/13 1237  INR 0.95 PENDING   No results found for this basename: LABURIN,  in the last 72 hours Results for orders placed during the hospital encounter of 11/21/13  CULTURE, BLOOD (ROUTINE X 2)     Status: None   Collection Time    11/21/13  1:50 PM      Result Value Ref Range Status   Specimen Description BLOOD RIGHT ARM   Final   Special Requests     Final   Value: Immunocompromised BOTTLES DRAWN AEROBIC AND ANAEROBIC 5CC EACH   Culture  Setup Time     Final   Value: 11/21/2013 20:43     Performed at [facility]   Culture     Final   Value:         BLOOD CULTURE RECEIVED NO GROWTH TO DATE CULTURE WILL BE HELD FOR 5 DAYS BEFORE ISSUING A FINAL NEGATIVE REPORT     Performed at [facility]   Report Status PENDING   Incomplete  CULTURE, BLOOD (ROUTINE X 2)     Status: None   Collection Time    11/21/13  2:15 PM      Result Value Ref Range Status   Specimen Description BLOOD RIGHT RADIAL   Final   Special Requests     Final   Value: Immunocompromised BOTTLES DRAWN AEROBIC AND ANAEROBIC 5CC EACH   Culture  Setup Time     Final   Value: 11/21/2013 20:43     Performed at [facility]   Culture     Final   Value:        BLOOD CULTURE RECEIVED NO GROWTH TO DATE CULTURE WILL BE HELD FOR 5 DAYS BEFORE ISSUING A FINAL NEGATIVE REPORT     Performed at [facility]   Report Status PENDING   Incomplete  MRSA PCR SCREENING     Status: Abnormal   Collection Time    11/21/13  6:21 PM      Result Value Ref Range Status   MRSA by PCR POSITIVE (*) NEGATIVE Final   Comment:  The GeneXpert MRSA Assay (FDA     approved for NASAL specimens     only), is one component of a     comprehensive MRSA colonization     surveillance program. It is not     intended to diagnose MRSA     infection nor to guide or     monitor treatment for     MRSA infections.     RESULT CALLED TO, READ BACK BY AND VERIFIED WITH:     Pincus Sanes RN 2225 11/21/13 A BROWNING    Studies/Results: Ct Head Wo Contrast  11/21/2013   CLINICAL DATA:  Altered mental status  EXAM: CT HEAD WITHOUT CONTRAST  TECHNIQUE: Contiguous axial images were obtained from the base of the skull through the vertex without intravenous contrast. Study was obtained within 24 hr of patient's arrival at the emergency department.  COMPARISON:  None.  FINDINGS: There is moderate diffuse atrophy. There is no mass, hemorrhage, extra-axial fluid collection, or midline shift. There is mild small vessel disease in the centra semiovale bilaterally. Gray-white compartments  elsewhere appear normal. There is no demonstrable acute infarct.  Bony calvarium appears intact. The mastoid air cells are clear. There is opacification of multiple ethmoid sinuses bilaterally. There is mild mucosal thickening in both maxillary antra.  IMPRESSION: Moderate diffuse atrophy with mild periventricular small vessel disease. No intracranial mass, hemorrhage or acute appearing infarct. Multifocal paranasal sinus disease bilaterally.   Electronically Signed   By: Lowella Grip M.D.   On: 11/21/2013 15:49   Dg Chest Port 1 View  11/22/2013   CLINICAL DATA:  Atelectasis  EXAM: PORTABLE CHEST - 1 VIEW  COMPARISON:  Chest x-ray from yesterday  FINDINGS: The previously reported nodular density in the left mid lung faintly persists. It is ovoid, with the largest dimension measuring 79mm. There is no edema or lobar consolidation. No evidence of effusion (as permitted by exclusion of lateral costophrenic sulci sign) or pneumothorax. Normal heart size.  IMPRESSION: Faintly persistent 2 cm nodule in the left chest. Chest CT followup is recommended to exclude a nodule. Preferably, this would be accomplished on an outpatient basis - when there has been time for clearing of any inflammatory or infectious infiltrates.   Electronically Signed   By: Jorje Guild M.D.   On: 11/22/2013 08:17   Dg Chest Port 1 View  11/21/2013   CLINICAL DATA:  Weakness  EXAM: PORTABLE CHEST - 1 VIEW  COMPARISON:  None.  FINDINGS: Cardiac shadow is within normal limits. The lungs are well aerated bilaterally. There is a questionable nodular density in the left mid lung superimposed over the anterior aspect of the third and fourth ribs. It measures approximately 17 mm. This may be related to overlying artifact. Short-term followup to assess for resolution is recommended.  IMPRESSION: Questionable nodular density in the left mid lung. Short-term followup is recommended. If the lesion persists, nonemergent CT of the chest is  recommended for further evaluation.   Electronically Signed   By: Inez Catalina M.D.   On: 11/21/2013 15:24    Assessment/Plan:   Difficult Foley catheter secondary to hypospadias and distal urethral stricture. The patient did have 300-350 cc in his bladder but does not appear to have an obvious postobstructive diuresis. Iced do not believe his renal failure is on an obstructive basis. His Foley catheter can come out with careful urinary monitoring is no longer needed.   LOS: 1 day   Zachory Mangual S 11/22/2013, 1:24 PM

## 2013-11-23 ENCOUNTER — Inpatient Hospital Stay (HOSPITAL_COMMUNITY): Payer: BC Managed Care – PPO

## 2013-11-23 DIAGNOSIS — D649 Anemia, unspecified: Secondary | ICD-10-CM

## 2013-11-23 DIAGNOSIS — N179 Acute kidney failure, unspecified: Secondary | ICD-10-CM

## 2013-11-23 DIAGNOSIS — N19 Unspecified kidney failure: Secondary | ICD-10-CM

## 2013-11-23 DIAGNOSIS — E860 Dehydration: Secondary | ICD-10-CM

## 2013-11-23 DIAGNOSIS — E119 Type 2 diabetes mellitus without complications: Secondary | ICD-10-CM | POA: Diagnosis present

## 2013-11-23 DIAGNOSIS — D72819 Decreased white blood cell count, unspecified: Secondary | ICD-10-CM

## 2013-11-23 DIAGNOSIS — D7589 Other specified diseases of blood and blood-forming organs: Secondary | ICD-10-CM

## 2013-11-23 DIAGNOSIS — D696 Thrombocytopenia, unspecified: Secondary | ICD-10-CM

## 2013-11-23 LAB — CBC
HEMATOCRIT: 21.7 % — AB (ref 39.0–52.0)
Hemoglobin: 7.6 g/dL — ABNORMAL LOW (ref 13.0–17.0)
MCH: 35.8 pg — ABNORMAL HIGH (ref 26.0–34.0)
MCHC: 35 g/dL (ref 30.0–36.0)
MCV: 102.4 fL — AB (ref 78.0–100.0)
Platelets: 73 10*3/uL — ABNORMAL LOW (ref 150–400)
RBC: 2.12 MIL/uL — AB (ref 4.22–5.81)
RDW: 18.8 % — ABNORMAL HIGH (ref 11.5–15.5)
WBC: 3 10*3/uL — AB (ref 4.0–10.5)

## 2013-11-23 LAB — HEMOGLOBIN A1C
Hgb A1c MFr Bld: 6.6 % — ABNORMAL HIGH (ref <5.7)
MEAN PLASMA GLUCOSE: 143 mg/dL — AB (ref <117)

## 2013-11-23 LAB — RENAL FUNCTION PANEL
Albumin: 2.4 g/dL — ABNORMAL LOW (ref 3.5–5.2)
BUN: 70 mg/dL — ABNORMAL HIGH (ref 6–23)
CALCIUM: 7.4 mg/dL — AB (ref 8.4–10.5)
CO2: 17 mEq/L — ABNORMAL LOW (ref 19–32)
Chloride: 100 mEq/L (ref 96–112)
Creatinine, Ser: 5.94 mg/dL — ABNORMAL HIGH (ref 0.50–1.35)
GFR calc Af Amer: 11 mL/min — ABNORMAL LOW (ref >90)
GFR, EST NON AFRICAN AMERICAN: 9 mL/min — AB (ref >90)
Glucose, Bld: 197 mg/dL — ABNORMAL HIGH (ref 70–99)
PHOSPHORUS: 3.9 mg/dL (ref 2.3–4.6)
Potassium: 4.6 mEq/L (ref 3.7–5.3)
Sodium: 134 mEq/L — ABNORMAL LOW (ref 137–147)

## 2013-11-23 LAB — TYPE AND SCREEN
ABO/RH(D): A POS
ANTIBODY SCREEN: NEGATIVE
Unit division: 0

## 2013-11-23 LAB — GLUCOSE, CAPILLARY
GLUCOSE-CAPILLARY: 397 mg/dL — AB (ref 70–99)
GLUCOSE-CAPILLARY: 301 mg/dL — AB (ref 70–99)
Glucose-Capillary: 122 mg/dL — ABNORMAL HIGH (ref 70–99)
Glucose-Capillary: 120 mg/dL — ABNORMAL HIGH (ref 70–99)
Glucose-Capillary: 303 mg/dL — ABNORMAL HIGH (ref 70–99)

## 2013-11-23 LAB — CORTISOL: Cortisol, Plasma: 7.5 ug/dL

## 2013-11-23 LAB — TSH: TSH: 4.766 u[IU]/mL — AB (ref 0.350–4.500)

## 2013-11-23 LAB — FOLATE RBC: RBC Folate: 268 ng/mL — ABNORMAL LOW (ref >280)

## 2013-11-23 MED ORDER — INSULIN ASPART 100 UNIT/ML ~~LOC~~ SOLN
0.0000 [IU] | Freq: Three times a day (TID) | SUBCUTANEOUS | Status: DC
  Administered 2013-11-23: 9 [IU] via SUBCUTANEOUS
  Administered 2013-11-24: 7 [IU] via SUBCUTANEOUS
  Administered 2013-11-24: 3 [IU] via SUBCUTANEOUS

## 2013-11-23 MED ORDER — SODIUM CHLORIDE 0.9 % IV SOLN: INTRAVENOUS | Status: DC

## 2013-11-23 MED ORDER — INSULIN ASPART 100 UNIT/ML ~~LOC~~ SOLN
0.0000 [IU] | SUBCUTANEOUS | Status: DC
  Administered 2013-11-23: 9 [IU] via SUBCUTANEOUS

## 2013-11-23 MED ORDER — STERILE WATER FOR INJECTION IV SOLN
INTRAVENOUS | Status: DC
  Administered 2013-11-23 – 2013-11-24 (×3): via INTRAVENOUS
  Filled 2013-11-23 (×9): qty 850

## 2013-11-23 MED ORDER — SODIUM BICARBONATE 650 MG PO TABS: 650.0000 mg | ORAL_TABLET | Freq: Three times a day (TID) | ORAL | Status: DC

## 2013-11-23 NOTE — Consult Note (Signed)
Reason for Referral: Anemia and thrombocytopenia.   HPI: 63 year old man currently of [place] where he lives by himself. He is a gentleman with a history of hypertension and diabetes but no history of any hematological disorder. He has been reporting feeling symptoms of the flu accompanied with failure to thrive and poor intake by mouth. He developed hypoglycemia and presented to the emergency department. He was transferred from [facility] after presenting there with syncope and hypoglycemia. He was found to have acute renal failure as well as worsening anemia and mild thrombocytopenia. His hemoglobin on presentation on 11/21/2013 was 8.5 and a platelet count of 126. After hydration his hemoglobin was down to 6.7 and a platelet count of 85. He received 2 units of packed red cell transfusion and currently his hemoglobin is 7.6 with a white blood count of 3.0 and a potential 73,000. His differential of his white cell count was normal. His MCV was elevated up to 110 and currently at 102. His anemia panel did show a B12 level at the 146 which is low. The iron levels are normal. His serum protein electrophoresis have been pain but is currently pending. I was asked to comment about these hematological findings. Clinically, he is feeling a lot better. Is not reporting fevers or chills or sweats. Does not report any constitutional symptoms. Is not reporting any back pain and shoulder pain or any bone pain. Did not report any neurological complaints or recurrent sinopulmonary infections. He continued to lives independently and up to this point is performing all activities of daily living.   Past Medical History  Diagnosis Date  . Hypertension   . Diabetes mellitus without complication   . Arthritis   . Wears glasses   . Anemia   . Basal cell carcinoma   :  Past Surgical History  Procedure Laterality Date  . Retinal detachment surgery  2004    left  . Skin split graft N/A 01/13/2013   Procedure: SPLIT THICKNESS SKIN GRAFT OF THE FOREHEAD;  Surgeon: Theodoro Kos, DO;  Location: [facility];  Service: Plastics;  Laterality: N/A;  Forehead;  Donor site right thigh. Split thickness skin graft  :  Current Facility-Administered Medications  Medication Dose Route Frequency Provider Last Rate Last Dose  . Chlorhexidine Gluconate Cloth 2 % PADS 6 each  6 each Topical Q0600 Elsie Stain, MD   6 each at 11/23/13 1022  . heparin injection 5,000 Units  5,000 Units Subcutaneous 3 times per day Elsie Stain, MD   5,000 Units at 11/23/13 0500  . insulin aspart (novoLOG) injection 0-9 Units  0-9 Units Subcutaneous TID WC Saima Rizwan, MD      . mupirocin ointment (BACTROBAN) 2 % 1 application  1 application Nasal BID Elsie Stain, MD   1 application at 28/31/51 1022  . pantoprazole (PROTONIX) EC tablet 40 mg  40 mg Oral Q1200 Chesley Mires, MD   40 mg at 11/22/13 1054  . sodium bicarbonate 150 mEq in sterile water 1,000 mL infusion   Intravenous Continuous Colbert Coyer, MD 75 mL/hr at 11/23/13 0156         Allergies  Allergen Reactions  . Altace [Ramipril]     weakness  :  History reviewed. No pertinent family history.:  History   Social History  . Marital Status: Single    Spouse Name: N/A    Number of Children: N/A  . Years of Education: N/A   Occupational  History  . Not on file.   Social History Main Topics  . Smoking status: Never Smoker   . Smokeless tobacco: Not on file  . Alcohol Use: No  . Drug Use: No  . Sexual Activity: Not on file   Other Topics Concern  . Not on file   Social History Narrative  . No narrative on file  :  Constitutional: negative for anorexia, chills, fevers and malaise Eyes: negative for icterus, irritation and redness Ears, nose, mouth, throat, and face: negative for hearing loss, hoarseness and nasal congestion Respiratory: negative for asthma, chronic bronchitis and cough Cardiovascular: negative  for chest pain, dyspnea and exertional chest pressure/discomfort Gastrointestinal: negative for abdominal pain, jaundice and nausea Genitourinary:negative for dysuria, frequency and hematuria Integument/breast: negative for  rash and skin color change Hematologic/lymphatic: negative for easy bruising, lymphadenopathy and petechiae Musculoskeletal:negative for arthralgias, back pain and bone pain Neurological: negative for coordination problems, gait problems and memory problems Behavioral/Psych: negative for depression and fatigue Endocrine: negative for temperature intolerance Allergic/Immunologic: negative for hay fever and urticaria  Exam: ECOG 1 Blood pressure 121/65, pulse 78, temperature 98.5 F (36.9 C), temperature source Oral, resp. rate 21, height $RemoveBe'5\' 10"'TlPdJoizT$  (1.778 m), weight 144 lb 2.9 oz (65.4 kg), SpO2 97.00%. General appearance: alert and cooperative. He appeared older than his stated age Head: Normocephalic, without obvious abnormality, atraumatic Throat: lips, mucosa, and tongue normal; teeth and gums normal Neck: no adenopathy, no carotid bruit, no JVD, supple, symmetrical, trachea midline and thyroid not enlarged, symmetric, no tenderness/mass/nodules Back: symmetric, no curvature. ROM normal. No CVA tenderness. Resp: clear to auscultation bilaterally Chest wall: no tenderness Cardio: regular rate and rhythm, S1, S2 normal, no murmur, click, rub or gallop GI: soft, non-tender; bowel sounds normal; no masses,  no organomegaly Extremities: extremities normal, atraumatic, no cyanosis or edema Pulses: 2+ and symmetric Skin: Skin color, texture, turgor normal. No rashes or lesions Lymph nodes: Cervical, supraclavicular, and axillary nodes normal. Neurologic: Grossly normal   Recent Labs  11/22/13 1840 11/23/13 0353  WBC 2.9* 3.0*  HGB 8.6* 7.6*  HCT 24.4* 21.7*  PLT 85* 73*    Recent Labs  11/22/13 1840 11/23/13 0935  NA 132* 134*  K 4.8 4.6  CL 101 100  CO2  14* 17*  GLUCOSE 338* 197*  BUN 73* 70*  CREATININE 6.18* 5.94*  CALCIUM 7.5* 7.4*     Blood smear review:  I personally reviewed his peripheral smear from 3/14 and 3/13. There is no clear-cut schistocytes or evidence of microangiopathy. His red cells appeared microcytic with very little rouleaux formation. Very few to drop cells. The white cells appeared normal and platelets slightly enlarged in size.   Assessment and Plan:   63 year old gentleman with an acute finding of anemia and mild thrombocytopenia. He has mild leukopenia and that reflects a normal differential however. This has presented in the setting of macrocytosis as well as acute renal failure. This was discovered after he presented with syncope dehydration. The differential diagnosis of these findings would include a plasma cell disorder such as multiple myeloma especially in the setting of macrocytosis and acute renal failure. This also be a severe vitamin B 12 deficiency can manifest as of for pancytopenia. It could also be a reactive to an acute viral illness causing severe dehydration and acute renal failure and subsequently anemia related to her his acute renal failure. I doubt there is any hemolysis and I doubt any acute leukemia at this time. Myelodysplastic syndrome was also  on the differential.  My recommendation at this point I will add few laboratory testing including LDH, reticulocyte count and that is supposed to level if they have not been drawn. I will await the results of his serum protein electrophoresis. If he has a monoclonal protein then he will need a skeletal survey as well as a bone marrow biopsy.  I will await these results and arrange for further testing depending on these results.  Thank you for allowing me to participate in this nice pleasant gentleman's care.

## 2013-11-23 NOTE — Progress Notes (Signed)
S: feels better O:BP 121/65  Pulse 78  Temp(Src) 98.5 F (36.9 C) (Oral)  Resp 21  Ht 5\' 10"  (1.778 m)  Wt 65.4 kg (144 lb 2.9 oz)  BMI 20.69 kg/m2  SpO2 97%  Intake/Output Summary (Last 24 hours) at 11/23/13 0813 Last data filed at 11/23/13 0700  Gross per 24 hour  Intake   2310 ml  Output   1525 ml  Net    785 ml   Weight change: -2.64 kg (-5 lb 13.1 oz) 11/25/13 and alert CVS:RRR Resp:clear Abd:+ BS NTND Ext:no edema NEURO:CNI Ox3 no asterixis   . Chlorhexidine Gluconate Cloth  6 each Topical Q0600  . heparin  5,000 Units Subcutaneous 3 times per day  . insulin aspart  0-9 Units Subcutaneous 6 times per day  . mupirocin ointment  1 application Nasal BID  . pantoprazole  40 mg Oral Q1200   Ct Head Wo Contrast  11/21/2013   CLINICAL DATA:  Altered mental status  EXAM: CT HEAD WITHOUT CONTRAST  TECHNIQUE: Contiguous axial images were obtained from the base of the skull through the vertex without intravenous contrast. Study was obtained within 24 hr of patient's arrival at the emergency department.  COMPARISON:  None.  FINDINGS: There is moderate diffuse atrophy. There is no mass, hemorrhage, extra-axial fluid collection, or midline shift. There is mild small vessel disease in the centra semiovale bilaterally. Gray-white compartments elsewhere appear normal. There is no demonstrable acute infarct.  Bony calvarium appears intact. The mastoid air cells are clear. There is opacification of multiple ethmoid sinuses bilaterally. There is mild mucosal thickening in both maxillary antra.  IMPRESSION: Moderate diffuse atrophy with mild periventricular small vessel disease. No intracranial mass, hemorrhage or acute appearing infarct. Multifocal paranasal sinus disease bilaterally.   Electronically Signed   By: 11/23/2013 M.D.   On: 11/21/2013 15:49   Dg Chest Port 1 View  11/22/2013   CLINICAL DATA:  Atelectasis  EXAM: PORTABLE CHEST - 1 VIEW  COMPARISON:  Chest x-ray from  yesterday  FINDINGS: The previously reported nodular density in the left mid lung faintly persists. It is ovoid, with the largest dimension measuring 57mm. There is no edema or lobar consolidation. No evidence of effusion (as permitted by exclusion of lateral costophrenic sulci sign) or pneumothorax. Normal heart size.  IMPRESSION: Faintly persistent 2 cm nodule in the left chest. Chest CT followup is recommended to exclude a nodule. Preferably, this would be accomplished on an outpatient basis - when there has been time for clearing of any inflammatory or infectious infiltrates.   Electronically Signed   By: 22m M.D.   On: 11/22/2013 08:17   Dg Chest Port 1 View  11/21/2013   CLINICAL DATA:  Weakness  EXAM: PORTABLE CHEST - 1 VIEW  COMPARISON:  None.  FINDINGS: Cardiac shadow is within normal limits. The lungs are well aerated bilaterally. There is a questionable nodular density in the left mid lung superimposed over the anterior aspect of the third and fourth ribs. It measures approximately 17 mm. This may be related to overlying artifact. Short-term followup to assess for resolution is recommended.  IMPRESSION: Questionable nodular density in the left mid lung. Short-term followup is recommended. If the lesion persists, nonemergent CT of the chest is recommended for further evaluation.   Electronically Signed   By: 11/23/2013 M.D.   On: 11/21/2013 15:24   BMET    Component Value Date/Time   NA 132* 11/22/2013 1840   K  4.8 11/22/2013 1840   CL 101 11/22/2013 1840   CO2 14* 11/22/2013 1840   GLUCOSE 338* 11/22/2013 1840   BUN 73* 11/22/2013 1840   CREATININE 6.18* 11/22/2013 1840   CALCIUM 7.5* 11/22/2013 1840   GFRNONAA 9* 11/22/2013 1840   GFRAA 10* 11/22/2013 1840   CBC    Component Value Date/Time   WBC 3.0* 11/23/2013 0353   RBC 2.12* 11/23/2013 0353   HGB 7.6* 11/23/2013 0353   HCT 21.7* 11/23/2013 0353   PLT 73* 11/23/2013 0353   MCV 102.4* 11/23/2013 0353   MCH 35.8* 11/23/2013 0353    MCHC 35.0 11/23/2013 0353   RDW 18.8* 11/23/2013 0353   LYMPHSABS 0.4* 11/21/2013 1350   MONOABS 0.4 11/21/2013 1350   EOSABS 0.0 11/21/2013 1350   BASOSABS 0.0 11/21/2013 1350     Assessment: 1. ARF most likely sec to hypotension and ARB but with thrombocytopenia trying to RO TTP.  No schistocytes seen on DIC smear.  UO improved but no Scr done this AM 2. Hypotension, BP better 3. Pancytopenia 4. Met acidosis on bicarb gtt  Plan: 1. STAT renal profile and then will decision if bicarb gtt needs to be continued 2. Recheck labs in AM 3. Await SPEP   Aaradhya Kysar T

## 2013-11-23 NOTE — Progress Notes (Signed)
[facility] Progress Note [place] TEAM 1 - Stepdown/ICU TEAM   Denver Bentson CZY:[id number] DOB: Feb 08, 1951 DOA: 11/21/2013 PCP: Woody Seller, MD  Brief narrative: Micheal Goodman is a 63 y.o. male presenting on 11/21/2013 with  has a past medical history of Hypertension; Diabetes mellitus without complication; Arthritis;  Anemia; and Basal cell carcinoma whowas transferred from [facility] after presenting there with syncope and hypoglycemia. Pt denies any history of renal insufficiency though Scr 5/14 was 1.5. On presentation Scr was 7.4. Denies gross hematuria, stones, obs sxs or use of NSAID's. Admits to 2wk or so hx of feeling weak and achy with decreased PO intake. He was on losartan and metformin PTA.    Subjective: No complaints.   Assessment/Plan: Principal Problem:   Acute renal failure - renal w/u underway- SPEP pending  Active Problems:   Anemia-thrombocytopenia - DIC panel essentially negative except for elevated d dimer- b/l duplex of legs ordered - s/p 1 U PRBC on 3/14 - hematology consulted  Elevated d dimer - duplex negative for DVT in legs    Hypoglycemia in  DM (diabetes mellitus) - resolved- cont sliding scale at low dose - change to TID AC- holding PO meds for now    Hypothermia - resolved- no infectious source found   Code Status: full code  Family Communication: none Disposition Plan: follow in SDU- PT eval  Consultants: Nephrology hematology  Procedures: none  Antibiotics: Antibiotics Given (last 72 hours)   Date/Time Action Medication Dose Rate   11/22/13 0526 Given   piperacillin-tazobactam (ZOSYN) IVPB 2.25 g 2.25 g 100 mL/hr       DVT prophylaxis: heparin  Objective: Filed Weights   11/21/13 1800 11/22/13 0500 11/23/13 0428  Weight: 61.1 kg (134 lb 11.2 oz) 61.8 kg (136 lb 3.9 oz) 65.4 kg (144 lb 2.9 oz)   Blood pressure 121/65, pulse 78, temperature 98.5 F (36.9 C), temperature source Oral, resp. rate  21, height 5\' 10"  (1.778 m), weight 65.4 kg (144 lb 2.9 oz), SpO2 97.00%.  Intake/Output Summary (Last 24 hours) at 11/23/13 0944 Last data filed at 11/23/13 0700  Gross per 24 hour  Intake   2310 ml  Output   1525 ml  Net    785 ml     Exam: General: No acute respiratory distress Lungs: Clear to auscultation bilaterally without wheezes or crackles Cardiovascular: Regular rate and rhythm without murmur gallop or rub normal S1 and S2 Abdomen: Nontender, nondistended, soft, bowel sounds positive, no rebound, no ascites, no appreciable mass Extremities: No significant cyanosis, clubbing, or edema bilateral lower extremities  Data Reviewed: Basic Metabolic Panel:  Recent Labs Lab 11/21/13 1350 11/21/13 2210 11/22/13 0340 11/22/13 1840  NA 136* 136* 135* 132*  K 5.2 5.4* 5.0 4.8  CL 98 106 106 101  CO2 15* 11* 13* 14*  GLUCOSE 132* 118* 117* 338*  BUN 93* 76* 77* 73*  CREATININE 7.40* 6.19* 6.28* 6.18*  CALCIUM 8.7 7.3* 7.3* 7.5*   Liver Function Tests:  Recent Labs Lab 11/21/13 1350  AST 9  ALT <5  ALKPHOS 45  BILITOT 0.2*  PROT 6.2  ALBUMIN 3.2*   No results found for this basename: LIPASE, AMYLASE,  in the last 168 hours No results found for this basename: AMMONIA,  in the last 168 hours CBC:  Recent Labs Lab 11/21/13 1350 11/22/13 0340 11/22/13 1237 11/22/13 1840 11/23/13 0353  WBC 5.4 3.0*  --  2.9* 3.0*  NEUTROABS 4.6  --   --   --   --  HGB 8.5* 6.7*  --  8.6* 7.6*  HCT 25.5* 19.5*  --  24.4* 21.7*  MCV 110.9* 107.1*  --  103.8* 102.4*  PLT 126* 85* 76* 85* 73*   Cardiac Enzymes:  Recent Labs Lab 11/21/13 1350  CKTOTAL 37  TROPONINI <0.30   BNP (last 3 results) No results found for this basename: PROBNP,  in the last 8760 hours CBG:  Recent Labs Lab 11/21/13 1615 11/21/13 1813 11/21/13 2330 11/22/13 0349 11/23/13 0819  GLUCAP 86 125* 144* 114* 122*    Recent Results (from the past 240 hour(s))  CULTURE, BLOOD (ROUTINE X 2)      Status: None   Collection Time    11/21/13  1:50 PM      Result Value Ref Range Status   Specimen Description BLOOD RIGHT ARM   Final   Special Requests     Final   Value: Immunocompromised BOTTLES DRAWN AEROBIC AND ANAEROBIC 5CC EACH   Culture  Setup Time     Final   Value: 11/21/2013 20:43     Performed at [facility]   Culture     Final   Value:        BLOOD CULTURE RECEIVED NO GROWTH TO DATE CULTURE WILL BE HELD FOR 5 DAYS BEFORE ISSUING A FINAL NEGATIVE REPORT     Performed at [facility]   Report Status PENDING   Incomplete  CULTURE, BLOOD (ROUTINE X 2)     Status: None   Collection Time    11/21/13  2:15 PM      Result Value Ref Range Status   Specimen Description BLOOD RIGHT RADIAL   Final   Special Requests     Final   Value: Immunocompromised BOTTLES DRAWN AEROBIC AND ANAEROBIC 5CC EACH   Culture  Setup Time     Final   Value: 11/21/2013 20:43     Performed at [facility]   Culture     Final   Value:        BLOOD CULTURE RECEIVED NO GROWTH TO DATE CULTURE WILL BE HELD FOR 5 DAYS BEFORE ISSUING A FINAL NEGATIVE REPORT     Performed at [facility]   Report Status PENDING   Incomplete  MRSA PCR SCREENING     Status: Abnormal   Collection Time    11/21/13  6:21 PM      Result Value Ref Range Status   MRSA by PCR POSITIVE (*) NEGATIVE Final   Comment:            The GeneXpert MRSA Assay (FDA     approved for NASAL specimens     only), is one component of a     comprehensive MRSA colonization     surveillance program. It is not     intended to diagnose MRSA     infection nor to guide or     monitor treatment for     MRSA infections.     RESULT CALLED TO, READ BACK BY AND VERIFIED WITH:     Pincus Sanes RN 2225 11/21/13 A BROWNING     Studies:  Recent x-ray studies have been reviewed in detail by the Attending Physician  Scheduled Meds:  Scheduled Meds: . Chlorhexidine Gluconate Cloth  6 each Topical Q0600  . heparin  5,000  Units Subcutaneous 3 times per day  . insulin aspart  0-9 Units Subcutaneous 6 times per day  . mupirocin ointment  1 application Nasal BID  .  pantoprazole  40 mg Oral Q1200   Continuous Infusions: .  sodium bicarbonate 150 mEq in sterile water 1000 mL infusion 75 mL/hr at 11/23/13 0156    Time spent on care of this patient: 35 min   Koppel, MD  [facility] Office  [phone] Pager - Text Page per Shea Evans as per below:  On-Call/Text Page:      [email or web address]  If 7PM-7AM, please contact night-coverage [email or web address] 11/23/2013, 9:44 AM   LOS: 2 days

## 2013-11-23 NOTE — Progress Notes (Signed)
VASCULAR LAB PRELIMINARY  PRELIMINARY  PRELIMINARY  PRELIMINARY  Bilateral lower extremity venous Dopplers completed.    Preliminary report:  There is no DVT or SVT noted in the bilateral lower extremities.  Abshir Paolini, RVT 11/23/2013, 12:14 PM

## 2013-11-23 NOTE — Progress Notes (Signed)
[place] Progress Note Patient Name: Kilian Schwartz DOB: 1951-06-27 MRN: [id number]  Date of Service  11/23/2013   HPI/Events of Note  Patient now with hyperglycemia on bicarb gtt with dextrose.  Current blood sugar of greater than 300.  Has AoCRI with current HCO3 on BMET of 14.   eICU Interventions  Plan: Place of q4 hour SSI renal dosing Change Bicarb gtt to sterile water with bicarb Has advanced to renal diet - will need change to insulin regimen in AM   Intervention Category Intermediate Interventions: Hyperglycemia - evaluation and treatment  Nakeshia Waldeck 11/23/2013, 12:12 AM

## 2013-11-24 DIAGNOSIS — D539 Nutritional anemia, unspecified: Secondary | ICD-10-CM

## 2013-11-24 DIAGNOSIS — E119 Type 2 diabetes mellitus without complications: Secondary | ICD-10-CM

## 2013-11-24 LAB — RENAL FUNCTION PANEL
ALBUMIN: 2.4 g/dL — AB (ref 3.5–5.2)
BUN: 69 mg/dL — AB (ref 6–23)
CHLORIDE: 97 meq/L (ref 96–112)
CO2: 22 mEq/L (ref 19–32)
Calcium: 7.2 mg/dL — ABNORMAL LOW (ref 8.4–10.5)
Creatinine, Ser: 5.65 mg/dL — ABNORMAL HIGH (ref 0.50–1.35)
GFR calc Af Amer: 11 mL/min — ABNORMAL LOW (ref >90)
GFR, EST NON AFRICAN AMERICAN: 10 mL/min — AB (ref >90)
Glucose, Bld: 252 mg/dL — ABNORMAL HIGH (ref 70–99)
PHOSPHORUS: 3.8 mg/dL (ref 2.3–4.6)
Potassium: 4.8 mEq/L (ref 3.7–5.3)
Sodium: 134 mEq/L — ABNORMAL LOW (ref 137–147)

## 2013-11-24 LAB — GLUCOSE, CAPILLARY
Comment 3: 288831
GLUCOSE-CAPILLARY: 206 mg/dL — AB (ref 70–99)
GLUCOSE-CAPILLARY: 208 mg/dL — AB (ref 70–99)
GLUCOSE-CAPILLARY: 310 mg/dL — AB (ref 70–99)
GLUCOSE-CAPILLARY: 352 mg/dL — AB (ref 70–99)
Glucose-Capillary: 112 mg/dL — ABNORMAL HIGH (ref 70–99)
Glucose-Capillary: 168 mg/dL — ABNORMAL HIGH (ref 70–99)
Glucose-Capillary: 187 mg/dL — ABNORMAL HIGH (ref 70–99)
Glucose-Capillary: 266 mg/dL — ABNORMAL HIGH (ref 70–99)
Glucose-Capillary: 89 mg/dL (ref 70–99)

## 2013-11-24 LAB — RETICULOCYTES
RBC.: 2.08 MIL/uL — ABNORMAL LOW (ref 4.22–5.81)
RETIC CT PCT: 0.4 % (ref 0.4–3.1)
Retic Count, Absolute: 8.3 10*3/uL — ABNORMAL LOW (ref 19.0–186.0)

## 2013-11-24 LAB — LACTATE DEHYDROGENASE: LDH: 161 U/L (ref 94–250)

## 2013-11-24 LAB — PATHOLOGIST SMEAR REVIEW

## 2013-11-24 MED ORDER — INSULIN GLARGINE 100 UNIT/ML ~~LOC~~ SOLN: 10.0000 [IU] | Freq: Every day | SUBCUTANEOUS | Status: DC

## 2013-11-24 MED ORDER — INSULIN ASPART 100 UNIT/ML ~~LOC~~ SOLN
0.0000 [IU] | Freq: Every day | SUBCUTANEOUS | Status: DC
Start: 2013-11-24 — End: 2013-12-01
  Administered 2013-11-28: 2 [IU] via SUBCUTANEOUS

## 2013-11-24 MED ORDER — INSULIN ASPART 100 UNIT/ML ~~LOC~~ SOLN
0.0000 [IU] | Freq: Three times a day (TID) | SUBCUTANEOUS | Status: DC
  Administered 2013-11-24 – 2013-11-26 (×4): 5 [IU] via SUBCUTANEOUS
  Administered 2013-11-27: 3 [IU] via SUBCUTANEOUS
  Administered 2013-11-27: 2 [IU] via SUBCUTANEOUS
  Administered 2013-11-28: 3 [IU] via SUBCUTANEOUS
  Administered 2013-11-29 (×2): 5 [IU] via SUBCUTANEOUS
  Administered 2013-11-30: 3 [IU] via SUBCUTANEOUS
  Administered 2013-11-30: 2 [IU] via SUBCUTANEOUS
  Administered 2013-11-30: 5 [IU] via SUBCUTANEOUS
  Administered 2013-12-01: 2 [IU] via SUBCUTANEOUS
  Administered 2013-12-01: 3 [IU] via SUBCUTANEOUS

## 2013-11-24 MED ORDER — INSULIN GLARGINE 100 UNIT/ML ~~LOC~~ SOLN
5.0000 [IU] | Freq: Every day | SUBCUTANEOUS | Status: DC
  Administered 2013-11-24: 5 [IU] via SUBCUTANEOUS
  Filled 2013-11-24: qty 0.05

## 2013-11-24 MED ORDER — INSULIN GLARGINE 100 UNIT/ML ~~LOC~~ SOLN
10.0000 [IU] | Freq: Every day | SUBCUTANEOUS | Status: DC
  Administered 2013-11-25 – 2013-11-29 (×5): 10 [IU] via SUBCUTANEOUS
  Filled 2013-11-24 (×6): qty 0.1

## 2013-11-24 NOTE — Progress Notes (Signed)
[facility] Progress Note [place] TEAM 1 - Stepdown/ICU TEAM   Quency Tober CBS:[id number] DOB: 06-21-1951 DOA: 11/21/2013 PCP: Woody Seller, MD  Brief narrative: Micheal Goodman is a 63 y.o. male presenting on 11/21/2013 with  has a past medical history of Hypertension; Diabetes mellitus without complication; Arthritis;  Anemia; and Basal cell carcinoma whowas transferred from [facility] after presenting there with syncope and hypoglycemia. Pt denies any history of renal insufficiency though Scr 5/14 was 1.5. On presentation Scr was 7.4. Denies gross hematuria, stones, obs sxs or use of NSAID's. Admits to 2wk or so hx of feeling weak and achy with decreased PO intake. He was on losartan and metformin PTA.  Subjective: No SOB or CP.  Assessment/Plan: Principal Problem:   Acute renal failure/metabolic acidosis - renal w/u underway- SPEP pending-briefly required bicarbonate infusion which has been discontinued -Per nephrology etiology felt secondary to hypotension and ARB so continue to hold antihypertensive medications for now -Continue to hold metformin  Active Problems:   Anemia-thrombocytopenia - DIC panel essentially negative except for elevated d dimer- b/l duplex of legs ordered - s/p 1 U PRBC on 3/14 - Hematology consulted: Differential includes B12/folate efficiency, plasma cell disorder or myelodysplastic syndrome-SPEP pending-in the interim was started on folic acid and vitamin B12 injections daily for 7 days -Hematology doubts TTP/HUS since no evidence of microangiopathy-doubtful this is DIC or HIT    Elevated d dimer - duplex negative for DVT in legs    Hypoglycemia in  DM- toxic encepahlopathy - resolved- cont sliding scale at low dose - changed to TID [facility] 3/15- held PO meds (Glucophage and Actos) but now note CBGs trending up greater than 250 and most recent reading 310 so adjust Lantus insulin (please note had already received a.m. dose of Lantus so  will not receive higher dose until a.m. of 11/25/2013) and increase sliding scale insulin accordingly    Hypertension -Medications on hold for now (see above) -Was on Norvasc, Cozaar and Toprol-XL at home -Watch for rebound tachycardia consistent with beta blocker withdrawal    Hypothermia - resolved- no infectious source found   Code Status: full code  Family Communication: none Disposition Plan: Transfer to floor  Consultants: Nephrology hematology  Procedures: Bilateral Lower Extremity Venous Duplex - No evidence of deep vein or superficial thrombosis involving the right lower extremity and left lower extremity. - No evidence of Baker's cyst on the right or left.  Antibiotics: Antibiotics Given (last 72 hours)   Date/Time Action Medication Dose Rate   11/22/13 0526 Given   piperacillin-tazobactam (ZOSYN) IVPB 2.25 g 2.25 g 100 mL/hr       DVT prophylaxis: heparin  Objective: Filed Weights   11/22/13 0500 11/23/13 0428 11/24/13 0500  Weight: 136 lb 3.9 oz (61.8 kg) 144 lb 2.9 oz (65.4 kg) 146 lb 2.6 oz (66.3 kg)   Blood pressure 130/72, pulse 48, temperature 98.1 F (36.7 C), temperature source Oral, resp. rate 27, height 5\' 10"  (1.778 m), weight 146 lb 2.6 oz (66.3 kg), SpO2 100.00%.  Intake/Output Summary (Last 24 hours) at 11/24/13 1338 Last data filed at 11/24/13 0600  Gross per 24 hour  Intake   1350 ml  Output   1900 ml  Net   -550 ml     Exam: General: No acute respiratory distress Lungs: Clear to auscultation bilaterally without wheezes or crackles Cardiovascular: Regular rate and rhythm without murmur gallop or rub normal S1 and S2 Abdomen: Nontender, nondistended, soft, bowel sounds positive,  no rebound, no ascites, no appreciable mass Extremities: No significant cyanosis, clubbing, or edema bilateral lower extremities  Data Reviewed: Basic Metabolic Panel:  Recent Labs Lab 11/21/13 2210 11/22/13 0340 11/22/13 1840 11/23/13 0935  11/24/13 0240  NA 136* 135* 132* 134* 134*  K 5.4* 5.0 4.8 4.6 4.8  CL 106 106 101 100 97  CO2 11* 13* 14* 17* 22  GLUCOSE 118* 117* 338* 197* 252*  BUN 76* 77* 73* 70* 69*  CREATININE 6.19* 6.28* 6.18* 5.94* 5.65*  CALCIUM 7.3* 7.3* 7.5* 7.4* 7.2*  PHOS  --   --   --  3.9 3.8   Liver Function Tests:  Recent Labs Lab 11/21/13 1350 11/23/13 0935 11/24/13 0240  AST 9  --   --   ALT <5  --   --   ALKPHOS 45  --   --   BILITOT 0.2*  --   --   PROT 6.2  --   --   ALBUMIN 3.2* 2.4* 2.4*   No results found for this basename: LIPASE, AMYLASE,  in the last 168 hours No results found for this basename: AMMONIA,  in the last 168 hours CBC:  Recent Labs Lab 11/21/13 1350 11/22/13 0340 11/22/13 1237 11/22/13 1840 11/23/13 0353  WBC 5.4 3.0*  --  2.9* 3.0*  NEUTROABS 4.6  --   --   --   --   HGB 8.5* 6.7*  --  8.6* 7.6*  HCT 25.5* 19.5*  --  24.4* 21.7*  MCV 110.9* 107.1*  --  103.8* 102.4*  PLT 126* 85* 76* 85* 73*   Cardiac Enzymes:  Recent Labs Lab 11/21/13 1350  CKTOTAL 37  TROPONINI <0.30   BNP (last 3 results) No results found for this basename: PROBNP,  in the last 8760 hours CBG:  Recent Labs Lab 11/23/13 1710 11/23/13 1952 11/24/13 0014 11/24/13 0730 11/24/13 1128  GLUCAP 397* 301* 266* 208* 310*    Recent Results (from the past 240 hour(s))  CULTURE, BLOOD (ROUTINE X 2)     Status: None   Collection Time    11/21/13  1:50 PM      Result Value Ref Range Status   Specimen Description BLOOD RIGHT ARM   Final   Special Requests     Final   Value: Immunocompromised BOTTLES DRAWN AEROBIC AND ANAEROBIC 5CC EACH   Culture  Setup Time     Final   Value: 11/21/2013 20:43     Performed at [facility]   Culture     Final   Value:        BLOOD CULTURE RECEIVED NO GROWTH TO DATE CULTURE WILL BE HELD FOR 5 DAYS BEFORE ISSUING A FINAL NEGATIVE REPORT     Performed at [facility]   Report Status PENDING   Incomplete  CULTURE, BLOOD  (ROUTINE X 2)     Status: None   Collection Time    11/21/13  2:15 PM      Result Value Ref Range Status   Specimen Description BLOOD RIGHT RADIAL   Final   Special Requests     Final   Value: Immunocompromised BOTTLES DRAWN AEROBIC AND ANAEROBIC 5CC EACH   Culture  Setup Time     Final   Value: 11/21/2013 20:43     Performed at [facility]   Culture     Final   Value:        BLOOD CULTURE RECEIVED NO GROWTH TO DATE CULTURE WILL  BE HELD FOR 5 DAYS BEFORE ISSUING A FINAL NEGATIVE REPORT     Performed at [facility]   Report Status PENDING   Incomplete  MRSA PCR SCREENING     Status: Abnormal   Collection Time    11/21/13  6:21 PM      Result Value Ref Range Status   MRSA by PCR POSITIVE (*) NEGATIVE Final   Comment:            The GeneXpert MRSA Assay (FDA     approved for NASAL specimens     only), is one component of a     comprehensive MRSA colonization     surveillance program. It is not     intended to diagnose MRSA     infection nor to guide or     monitor treatment for     MRSA infections.     RESULT CALLED TO, READ BACK BY AND VERIFIED WITH:     Pincus Sanes RN 2225 11/21/13 A BROWNING     Studies:  Recent x-ray studies have been reviewed in detail by the Attending Physician  Scheduled Meds:  Scheduled Meds: . Chlorhexidine Gluconate Cloth  6 each Topical Q0600  . heparin  5,000 Units Subcutaneous 3 times per day  . insulin aspart  0-9 Units Subcutaneous TID WC  . insulin glargine  5 Units Subcutaneous Daily  . mupirocin ointment  1 application Nasal BID  . pantoprazole  40 mg Oral Q1200   Continuous Infusions: .  sodium bicarbonate 150 mEq in sterile water 1000 mL infusion 75 mL/hr at 11/24/13 [id number]    Time spent on care of this patient: 35 min   ELLIS,ALLISON L., ANP  [facility] Office  [phone] Pager - Text Page per Shea Evans as per below:  On-Call/Text Page:      [email or web address]  If 7PM-7AM, please contact  night-coverage [email or web address] 11/24/2013, 1:38 PM   LOS: 3 days   I have examined the patient, reviewed the chart and modified the above note which I agree with.   Tyreque Finken,MD Pager # on [email or web address] 11/24/2013, 3:27 PM

## 2013-11-24 NOTE — Progress Notes (Signed)
IP PROGRESS NOTE  Subjective:   Patient reports no complaints this morning. He has not reported any increase pain overnight. Urine output has been adequate. Has not reported any fevers chills or sweats.  Objective:  Vital signs in last 24 hours: Temp:  [97.1 F (36.2 C)-98.3 F (36.8 C)] 97.7 F (36.5 C) (03/16 0731) Pulse Rate:  [46-89] 89 (03/16 0731) Resp:  [16-24] 19 (03/16 0731) BP: (124-147)/(75-83) 130/75 mmHg (03/16 0731) SpO2:  [96 %-100 %] 98 % (03/16 0731) Weight:  [146 lb 2.6 oz (66.3 kg)] 146 lb 2.6 oz (66.3 kg) (03/16 0500) Weight change: 1 lb 15.8 oz (0.9 kg) Last BM Date: 11/20/13  Intake/Output from previous day: 03/15 0701 - 03/16 0700 In: 2025 [P.O.:300; I.V.:1725] Out: 2275 [Urine:2275]  Mouth: mucous membranes moist, pharynx normal without lesions Resp: clear to auscultation bilaterally Cardio: regular rate and rhythm, S1, S2 normal, no murmur, click, rub or gallop GI: soft, non-tender; bowel sounds normal; no masses,  no organomegaly Extremities: extremities normal, atraumatic, no cyanosis or edema    Lab Results:  Recent Labs  11/22/13 1840 11/23/13 0353  WBC 2.9* 3.0*  HGB 8.6* 7.6*  HCT 24.4* 21.7*  PLT 85* 73*    BMET  Recent Labs  11/23/13 0935 11/24/13 0240  NA 134* 134*  K 4.6 4.8  CL 100 97  CO2 17* 22  GLUCOSE 197* 252*  BUN 70* 69*  CREATININE 5.94* 5.65*  CALCIUM 7.4* 7.2*    Studies/Results: US Renal Port  11/23/2013   CLINICAL DATA:  Acute renal failure.  EXAM: RENAL/URINARY TRACT ULTRASOUND COMPLETE  COMPARISON:  US RENAL dated 08/26/2012  FINDINGS: Right Kidney:  Length: 12.5 cm. Increased echogenicity. Multiple small simple appearing renal cysts. No hydronephrosis.  Left Kidney:  Length: 12.6 cm. Increased echogenicity. Multiple small simple appearing cysts. No hydronephrosis.  Bladder:  Collapsed around a Foley catheter.  IMPRESSION: 1.  No hydronephrosis. 2. Increased renal echogenicity, suggesting medical renal  disease.   Electronically Signed   By: Abigail Miyamoto M.D.   On: 11/23/2013 12:37    Medications: I have reviewed the patient's current medications.  Assessment/Plan:  63 year old gentleman with the following issues:  1. Macrocytic anemia in the setting of acute renal failure. The differential diagnosis includes a vitamin B12 and folate deficiency, plasma cell disorder as well as myelodysplastic syndrome. I am still awaiting his serum protein electrophoresis but I do recommend starting him on folic acid 1 mg daily as well as vitamin B 12 injections 1000 mcg daily for 7 days.  2. Thrombocytopenia: There is no evidence of microangiopathy to suggest TTP/HUS. I doubt that this is DIC either or HIT. MDS and plasma cell disorder are still on the differential. I recommended repeating his CBC tomorrow for followup purposes.  3. Renal failure: His creatinine slowly improved and his urine output remained adequate. There is no indication for hemodialysis per nephrology.  I will continue to monitor his labs and provide updates as needed.    LOS: 3 days   [id number] 11/24/2013, 11:10 AM

## 2013-11-24 NOTE — Clinical Documentation Improvement (Signed)
Possible Clinical Conditions?   Encephalopathy (describe type if known)                       Anoxic                       Septic                       Alcoholic                        Hepatic                       Hypertensive                       Metabolic                       Toxic Traumatic brain injury Traumatic brain hemorrhage Drug induced delirium  Poisoning / Overdose Other Condition Cannot Clinically Determine    Risk Factors: Patient found lying on the floor with a blood sugar of 32; improvement of mental status after receiving D50.   Thank You, Theron Arista, Clinical Documentation Specialist:  [phone]  [place] Information Management

## 2013-11-24 NOTE — Progress Notes (Signed)
Patient ID: Micheal Goodman, male   DOB: [birth year], 63 y.o.   MRN: [id number]  [facility] Progress Note    Assessment/ Plan:   1. ARF-non-oliguric on CKD 3: most likely sec to hypotension and ARB . No evidence of TTP so far based on labs after initial clinical suspicion, renal function improving sluggishly- no indications for HD at this time. Renal ultrasound noted- no hydronephrosis and evidence of CKD 2. Hypotension, BP better off anti-HTN Rx and s/p IVFs 3. Pancytopenia: seen by hematology- plasma cell dyscrasia screening pending. Hemolytic markers are negative so far (LDH normal) 4. Metabolic acidosis (from AKI and metformin- lactic acidosis) on bicarb gtt---DC HCO3 gtt when co2>25.   Subjective:   Reports to be doing better today. Eating without problems   Objective:   BP 130/75  Pulse 89  Temp(Src) 97.7 F (36.5 C) (Oral)  Resp 19  Ht 5\' 10"  (1.778 m)  Wt 66.3 kg (146 lb 2.6 oz)  BMI 20.97 kg/m2  SpO2 98%  Intake/Output Summary (Last 24 hours) at 11/24/13 0272 Last data filed at 11/24/13 0600  Gross per 24 hour  Intake   1770 ml  Output   2275 ml  Net   -505 ml   Weight change: 0.9 kg (1 lb 15.8 oz)  Physical Exam: ZDG:[id number] sitting up ion bed, eating b/fast DGL:OVFIE RRR, normal S1 and S2 Resp:CTAbilaterally, no rales/rhonchi PPI:RJJO, obese, NT, BS normal Ext:No LE edema  Imaging: US Renal Port  11/23/2013   CLINICAL DATA:  Acute renal failure.  EXAM: RENAL/URINARY TRACT ULTRASOUND COMPLETE  COMPARISON:  US RENAL dated 08/26/2012  FINDINGS: Right Kidney:  Length: 12.5 cm. Increased echogenicity. Multiple small simple appearing renal cysts. No hydronephrosis.  Left Kidney:  Length: 12.6 cm. Increased echogenicity. Multiple small simple appearing cysts. No hydronephrosis.  Bladder:  Collapsed around a Foley catheter.  IMPRESSION: 1.  No hydronephrosis. 2. Increased renal echogenicity, suggesting medical renal disease.   Electronically Signed    By: Abigail Miyamoto M.D.   On: 11/23/2013 12:37    Labs: BMET  Recent Labs Lab 11/21/13 1350 11/21/13 2210 11/22/13 0340 11/22/13 1840 11/23/13 0935 11/24/13 0240  NA 136* 136* 135* 132* 134* 134*  K 5.2 5.4* 5.0 4.8 4.6 4.8  CL 98 106 106 101 100 97  CO2 15* 11* 13* 14* 17* 22  GLUCOSE 132* 118* 117* 338* 197* 252*  BUN 93* 76* 77* 73* 70* 69*  CREATININE 7.40* 6.19* 6.28* 6.18* 5.94* 5.65*  CALCIUM 8.7 7.3* 7.3* 7.5* 7.4* 7.2*  PHOS  --   --   --   --  3.9 3.8   CBC  Recent Labs Lab 11/21/13 1350 11/22/13 0340 11/22/13 1237 11/22/13 1840 11/23/13 0353  WBC 5.4 3.0*  --  2.9* 3.0*  NEUTROABS 4.6  --   --   --   --   HGB 8.5* 6.7*  --  8.6* 7.6*  HCT 25.5* 19.5*  --  24.4* 21.7*  MCV 110.9* 107.1*  --  103.8* 102.4*  PLT 126* 85* 76* 85* 73*   Medications:    . Chlorhexidine Gluconate Cloth  6 each Topical Q0600  . heparin  5,000 Units Subcutaneous 3 times per day  . insulin aspart  0-9 Units Subcutaneous TID WC  . mupirocin ointment  1 application Nasal BID  . pantoprazole  40 mg Oral Q1200   Elmarie Shiley, MD 11/24/2013, 8:28 AM

## 2013-11-25 LAB — CBC WITH DIFFERENTIAL/PLATELET
BASOS PCT: 0 % (ref 0–1)
Basophils Absolute: 0 10*3/uL (ref 0.0–0.1)
Eosinophils Absolute: 0.1 10*3/uL (ref 0.0–0.7)
Eosinophils Relative: 2 % (ref 0–5)
HCT: 21.5 % — ABNORMAL LOW (ref 39.0–52.0)
Hemoglobin: 7.4 g/dL — ABNORMAL LOW (ref 13.0–17.0)
Lymphocytes Relative: 17 % (ref 12–46)
Lymphs Abs: 0.6 10*3/uL — ABNORMAL LOW (ref 0.7–4.0)
MCH: 35.6 pg — ABNORMAL HIGH (ref 26.0–34.0)
MCHC: 34.4 g/dL (ref 30.0–36.0)
MCV: 103.4 fL — ABNORMAL HIGH (ref 78.0–100.0)
MONO ABS: 0.3 10*3/uL (ref 0.1–1.0)
Monocytes Relative: 10 % (ref 3–12)
NEUTROS ABS: 2.5 10*3/uL (ref 1.7–7.7)
Neutrophils Relative %: 71 % (ref 43–77)
Platelets: 72 10*3/uL — ABNORMAL LOW (ref 150–400)
RBC: 2.08 MIL/uL — ABNORMAL LOW (ref 4.22–5.81)
RDW: 17.4 % — AB (ref 11.5–15.5)
WBC: 3.5 10*3/uL — AB (ref 4.0–10.5)

## 2013-11-25 LAB — RENAL FUNCTION PANEL
Albumin: 2.4 g/dL — ABNORMAL LOW (ref 3.5–5.2)
Albumin: 2.5 g/dL — ABNORMAL LOW (ref 3.5–5.2)
BUN: 63 mg/dL — ABNORMAL HIGH (ref 6–23)
BUN: 61 mg/dL — ABNORMAL HIGH (ref 6–23)
CO2: 28 mEq/L (ref 19–32)
CO2: 29 mEq/L (ref 19–32)
Calcium: 7.2 mg/dL — ABNORMAL LOW (ref 8.4–10.5)
Calcium: 7.2 mg/dL — ABNORMAL LOW (ref 8.4–10.5)
Chloride: 94 mEq/L — ABNORMAL LOW (ref 96–112)
Chloride: 93 mEq/L — ABNORMAL LOW (ref 96–112)
Creatinine, Ser: 5.47 mg/dL — ABNORMAL HIGH (ref 0.50–1.35)
Creatinine, Ser: 5.5 mg/dL — ABNORMAL HIGH (ref 0.50–1.35)
GFR calc Af Amer: 12 mL/min — ABNORMAL LOW (ref >90)
GFR calc Af Amer: 12 mL/min — ABNORMAL LOW (ref >90)
GFR calc non Af Amer: 10 mL/min — ABNORMAL LOW (ref >90)
GFR calc non Af Amer: 10 mL/min — ABNORMAL LOW (ref >90)
Glucose, Bld: 118 mg/dL — ABNORMAL HIGH (ref 70–99)
Glucose, Bld: 208 mg/dL — ABNORMAL HIGH (ref 70–99)
Phosphorus: 3.9 mg/dL (ref 2.3–4.6)
Phosphorus: 3.7 mg/dL (ref 2.3–4.6)
Potassium: 4 mEq/L (ref 3.7–5.3)
Potassium: 4 mEq/L (ref 3.7–5.3)
Sodium: 137 mEq/L (ref 137–147)
Sodium: 136 mEq/L — ABNORMAL LOW (ref 137–147)

## 2013-11-25 LAB — PROTEIN ELECTROPHORESIS, SERUM
ALPHA-2-GLOBULIN: 14.4 % — AB (ref 7.1–11.8)
Albumin ELP: 58.8 % (ref 55.8–66.1)
Alpha-1-Globulin: 6 % — ABNORMAL HIGH (ref 2.9–4.9)
Beta 2: 4.5 % (ref 3.2–6.5)
Beta Globulin: 5.7 % (ref 4.7–7.2)
GAMMA GLOBULIN: 10.6 % — AB (ref 11.1–18.8)
M-SPIKE, %: NOT DETECTED g/dL
TOTAL PROTEIN ELP: 3.3 g/dL — AB (ref 6.0–8.3)

## 2013-11-25 LAB — GLUCOSE, CAPILLARY
Glucose-Capillary: 106 mg/dL — ABNORMAL HIGH (ref 70–99)
Glucose-Capillary: 202 mg/dL — ABNORMAL HIGH (ref 70–99)
Glucose-Capillary: 201 mg/dL — ABNORMAL HIGH (ref 70–99)
Glucose-Capillary: 163 mg/dL — ABNORMAL HIGH (ref 70–99)

## 2013-11-25 MED ORDER — SODIUM CHLORIDE 0.9 % IV SOLN
INTRAVENOUS | Status: AC
  Administered 2013-11-25: 50 mL/h via INTRAVENOUS
  Administered 2013-11-26: 06:00:00 via INTRAVENOUS

## 2013-11-25 NOTE — Progress Notes (Signed)
PATIENT DETAILS Name: Micheal Goodman Age: 63 y.o. Sex: male Date of Birth: 01/13/1951 Admit Date: 11/21/2013 Admitting Physician Elsie Stain, MD VFI:EPPIRJ,JOAC HENRY, MD  Brief narrative: Micheal Goodman is a 63 y.o. male presenting on 11/21/2013 with has a past medical history of Hypertension; Diabetes mellitus without complication; Arthritis; Anemia; and Basal cell carcinoma whowas transferred from [facility] after presenting there with syncope and hypoglycemia. Pt denies any history of renal insufficiency though Scr 5/14 was 1.5. On presentation Scr was 7.4. He was briefly started on IV fluids, was also found to have anemia and thrombocytopenia, nephrology and hematology has been consulted. We'll function is slowly improving, work up for anemia is in progress Subjective: No major complaints overnight.  Assessment/Plan: Acute renal failure/metabolic acidosis  - Patient was admitted, briefly required IV bicarbonate infusion, however now on normal saline 50 cc an hour. Workup underway, however current suspicion for ATN secondary to hypotension and ARB use. However given anemia and thrombocytopenia, multiple myeloma in the differential, SPEP pending. - Renal ultrasound on 3/15 negative for hydronephrosis, UA on 3/14 negative for proteinuria.  Anemia-thrombocytopenia  - On admission found to have new onset of anemia and thrombocytopenia, workup underway, and DIC panel negative, no evidence of microangiopathy therefore unlikely to be TTP, however ADAMTS13 pending. Did get 1 unit of PRBC on 3/14. Does have low vitamin B12 and folate levels, has been started on vitamin B12 and folate supplementation. - Given renal failure, SPEP pending, hematology following some suspicion for myelodysplastic syndrome as well. - Continue to monitor CBC periodically  Elevated d dimer  - duplex negative for DVT in legs   Hypoglycemia  - Patient was hypoglycemic on presentation - This is  resolved, likely secondary to worsening renal function and the use of Lantus another oral hypoglycemic medications  History of diabetes - Hemoglobin A1c 6.6, given renal failure and hypoglycemic presentation would allow some permissive hypoglycemia. - Started back on Lantus and SSI, CBGs controlled, continue to monitor closely  Hypertension  - Blood pressure currently controlled without the use of any antihypertensive medications. Continue to monitor and resume when able.  Hypothermia  - resolved- no infectious source found, likely secondary to hypoglycemia  Disposition: Remain inpatient- await physical therapy evaluation.  DVT Prophylaxis: Prophylactic Heparin - watch platelets  Code Status: Full code   Family Communication None at bedside  Procedures:  NonE  CONSULTS:  nephrology and hematology/oncology  Time spent 40 minutes-which includes 50% of the time with face-to-face with patient/ family and coordinating care related to the above assessment and plan.    MEDICATIONS: Scheduled Meds: . Chlorhexidine Gluconate Cloth  6 each Topical Q0600  . heparin  5,000 Units Subcutaneous 3 times per day  . insulin aspart  0-15 Units Subcutaneous TID WC  . insulin aspart  0-5 Units Subcutaneous QHS  . insulin glargine  10 Units Subcutaneous Daily  . mupirocin ointment  1 application Nasal BID  . pantoprazole  40 mg Oral Q1200   Continuous Infusions: . sodium chloride     PRN Meds:.  Antibiotics: Anti-infectives   Start     Dose/Rate Route Frequency Ordered Stop   11/22/13 0600  piperacillin-tazobactam (ZOSYN) IVPB 2.25 g  Status:  Discontinued     2.25 g 100 mL/hr over 30 Minutes Intravenous 3 times per day 11/21/13 1904 11/22/13 0905   11/21/13 1615  piperacillin-tazobactam (ZOSYN) IVPB 3.375 g  Status:  Discontinued     3.375  g 12.5 mL/hr over 240 Minutes Intravenous  Once 11/21/13 1603 11/22/13 1511   11/21/13 1615  vancomycin (VANCOCIN) IVPB 1000 mg/200 mL  premix     1,000 mg 200 mL/hr over 60 Minutes Intravenous  Once 11/21/13 1603 11/21/13 1738       PHYSICAL EXAM: Vital signs in last 24 hours: Filed Vitals:   11/24/13 1723 11/24/13 2026 11/25/13 0534 11/25/13 0643  BP: 138/91 128/78  144/84  Pulse: 86 82  103  Temp: 98.4 F (36.9 C) 97.5 F (36.4 C)  97.6 F (36.4 C)  TempSrc: Oral Oral  Oral  Resp: $Remo'20 20  20  'XNnqz$ Height:      Weight:   65.59 kg (144 lb 9.6 oz)   SpO2: 100% 97%  97%    Weight change: -0.71 kg (-1 lb 9 oz) Filed Weights   11/23/13 0428 11/24/13 0500 11/25/13 0534  Weight: 65.4 kg (144 lb 2.9 oz) 66.3 kg (146 lb 2.6 oz) 65.59 kg (144 lb 9.6 oz)   Body mass index is 20.75 kg/(m^2).   Gen Exam: Awake and alert with clear speech.   Neck: Supple, No JVD.   Chest: B/L Clear.   CVS: S1 S2 Regular, no murmurs.  Abdomen: soft, BS +, non tender, non distended.  Extremities: no edema, lower extremities warm to touch. Neurologic: Non Focal.   Skin: No Rash.   Wounds: N/A.   Intake/Output from previous day:  Intake/Output Summary (Last 24 hours) at 11/25/13 1021 Last data filed at 11/25/13 0839  Gross per 24 hour  Intake 1737.5 ml  Output    855 ml  Net  882.5 ml     LAB RESULTS: CBC  Recent Labs Lab 11/21/13 1350 11/22/13 0340 11/22/13 1237 11/22/13 1840 11/23/13 0353 11/25/13 0500  WBC 5.4 3.0*  --  2.9* 3.0* 3.5*  HGB 8.5* 6.7*  --  8.6* 7.6* 7.4*  HCT 25.5* 19.5*  --  24.4* 21.7* 21.5*  PLT 126* 85* 76* 85* 73* 72*  MCV 110.9* 107.1*  --  103.8* 102.4* 103.4*  MCH 37.0* 36.8*  --  36.6* 35.8* 35.6*  MCHC 33.3 34.4  --  35.2 35.0 34.4  RDW 16.4* 17.4*  --  19.2* 18.8* 17.4*  LYMPHSABS 0.4*  --   --   --   --  0.6*  MONOABS 0.4  --   --   --   --  0.3  EOSABS 0.0  --   --   --   --  0.1  BASOSABS 0.0  --   --   --   --  0.0    Chemistries   Recent Labs Lab 11/22/13 0340 11/22/13 1840 11/23/13 0935 11/24/13 0240 11/25/13 0838  NA 135* 132* 134* 134* 137  K 5.0 4.8 4.6 4.8 4.0    CL 106 101 100 97 94*  CO2 13* 14* 17* 22 28  GLUCOSE 117* 338* 197* 252* 118*  BUN 77* 73* 70* 69* 63*  CREATININE 6.28* 6.18* 5.94* 5.65* 5.47*  CALCIUM 7.3* 7.5* 7.4* 7.2* 7.2*    CBG:  Recent Labs Lab 11/24/13 1128 11/24/13 1640 11/24/13 1732 11/24/13 2212 11/25/13 0753  GLUCAP 310* 206* 187* 168* 106*    GFR Estimated Creatinine Clearance: 13.2 ml/min (by C-G formula based on Cr of 5.47).  Coagulation profile  Recent Labs Lab 11/21/13 1438 11/22/13 1237  INR 0.95 1.10    Cardiac Enzymes  Recent Labs Lab 11/21/13 1350  TROPONINI <0.30    No components  found with this basename: POCBNP,   Recent Labs  11/22/13 1237  DDIMER 1.71*   No results found for this basename: HGBA1C,  in the last 72 hours No results found for this basename: CHOL, HDL, LDLCALC, TRIG, CHOLHDL, LDLDIRECT,  in the last 72 hours  Recent Labs  11/22/13 1840  TSH 4.766*    Recent Labs  11/24/13 0240  RETICCTPCT 0.4   No results found for this basename: LIPASE, AMYLASE,  in the last 72 hours  Urine Studies No results found for this basename: UACOL, UAPR, USPG, UPH, UTP, UGL, UKET, UBIL, UHGB, UNIT, UROB, ULEU, UEPI, UWBC, URBC, UBAC, CAST, CRYS, UCOM, BILUA,  in the last 72 hours  MICROBIOLOGY: Recent Results (from the past 240 hour(s))  CULTURE, BLOOD (ROUTINE X 2)     Status: None   Collection Time    11/21/13  1:50 PM      Result Value Ref Range Status   Specimen Description BLOOD RIGHT ARM   Final   Special Requests     Final   Value: Immunocompromised BOTTLES DRAWN AEROBIC AND ANAEROBIC 5CC EACH   Culture  Setup Time     Final   Value: 11/21/2013 20:43     Performed at [facility]   Culture     Final   Value:        BLOOD CULTURE RECEIVED NO GROWTH TO DATE CULTURE WILL BE HELD FOR 5 DAYS BEFORE ISSUING A FINAL NEGATIVE REPORT     Performed at [facility]   Report Status PENDING   Incomplete  CULTURE, BLOOD (ROUTINE X 2)     Status: None    Collection Time    11/21/13  2:15 PM      Result Value Ref Range Status   Specimen Description BLOOD RIGHT RADIAL   Final   Special Requests     Final   Value: Immunocompromised BOTTLES DRAWN AEROBIC AND ANAEROBIC 5CC EACH   Culture  Setup Time     Final   Value: 11/21/2013 20:43     Performed at [facility]   Culture     Final   Value:        BLOOD CULTURE RECEIVED NO GROWTH TO DATE CULTURE WILL BE HELD FOR 5 DAYS BEFORE ISSUING A FINAL NEGATIVE REPORT     Performed at [facility]   Report Status PENDING   Incomplete  MRSA PCR SCREENING     Status: Abnormal   Collection Time    11/21/13  6:21 PM      Result Value Ref Range Status   MRSA by PCR POSITIVE (*) NEGATIVE Final   Comment:            The GeneXpert MRSA Assay (FDA     approved for NASAL specimens     only), is one component of a     comprehensive MRSA colonization     surveillance program. It is not     intended to diagnose MRSA     infection nor to guide or     monitor treatment for     MRSA infections.     RESULT CALLED TO, READ BACK BY AND VERIFIED WITH:     Pincus Sanes RN 2225 11/21/13 A BROWNING    RADIOLOGY STUDIES/RESULTS: Ct Head Wo Contrast  11/21/2013   CLINICAL DATA:  Altered mental status  EXAM: CT HEAD WITHOUT CONTRAST  TECHNIQUE: Contiguous axial images were obtained from the base of the skull  through the vertex without intravenous contrast. Study was obtained within 24 hr of patient's arrival at the emergency department.  COMPARISON:  None.  FINDINGS: There is moderate diffuse atrophy. There is no mass, hemorrhage, extra-axial fluid collection, or midline shift. There is mild small vessel disease in the centra semiovale bilaterally. Gray-white compartments elsewhere appear normal. There is no demonstrable acute infarct.  Bony calvarium appears intact. The mastoid air cells are clear. There is opacification of multiple ethmoid sinuses bilaterally. There is mild mucosal thickening in both  maxillary antra.  IMPRESSION: Moderate diffuse atrophy with mild periventricular small vessel disease. No intracranial mass, hemorrhage or acute appearing infarct. Multifocal paranasal sinus disease bilaterally.   Electronically Signed   By: Lowella Grip M.D.   On: 11/21/2013 15:49   US Renal Port  11/23/2013   CLINICAL DATA:  Acute renal failure.  EXAM: RENAL/URINARY TRACT ULTRASOUND COMPLETE  COMPARISON:  US RENAL dated 08/26/2012  FINDINGS: Right Kidney:  Length: 12.5 cm. Increased echogenicity. Multiple small simple appearing renal cysts. No hydronephrosis.  Left Kidney:  Length: 12.6 cm. Increased echogenicity. Multiple small simple appearing cysts. No hydronephrosis.  Bladder:  Collapsed around a Foley catheter.  IMPRESSION: 1.  No hydronephrosis. 2. Increased renal echogenicity, suggesting medical renal disease.   Electronically Signed   By: Abigail Miyamoto M.D.   On: 11/23/2013 12:37   Dg Chest Port 1 View  11/22/2013   CLINICAL DATA:  Atelectasis  EXAM: PORTABLE CHEST - 1 VIEW  COMPARISON:  Chest x-ray from yesterday  FINDINGS: The previously reported nodular density in the left mid lung faintly persists. It is ovoid, with the largest dimension measuring 43mm. There is no edema or lobar consolidation. No evidence of effusion (as permitted by exclusion of lateral costophrenic sulci sign) or pneumothorax. Normal heart size.  IMPRESSION: Faintly persistent 2 cm nodule in the left chest. Chest CT followup is recommended to exclude a nodule. Preferably, this would be accomplished on an outpatient basis - when there has been time for clearing of any inflammatory or infectious infiltrates.   Electronically Signed   By: Jorje Guild M.D.   On: 11/22/2013 08:17   Dg Chest Port 1 View  11/21/2013   CLINICAL DATA:  Weakness  EXAM: PORTABLE CHEST - 1 VIEW  COMPARISON:  None.  FINDINGS: Cardiac shadow is within normal limits. The lungs are well aerated bilaterally. There is a questionable nodular density  in the left mid lung superimposed over the anterior aspect of the third and fourth ribs. It measures approximately 17 mm. This may be related to overlying artifact. Short-term followup to assess for resolution is recommended.  IMPRESSION: Questionable nodular density in the left mid lung. Short-term followup is recommended. If the lesion persists, nonemergent CT of the chest is recommended for further evaluation.   Electronically Signed   By: Inez Catalina M.D.   On: 11/21/2013 15:24    Oren Binet, MD  [facility] Pager:[phone]  If 7PM-7AM, please contact night-coverage [email or web address] Password [email or web address] 11/25/2013, 10:21 AM   LOS: 4 days

## 2013-11-25 NOTE — Progress Notes (Signed)
Patient ID: Micheal Goodman, male   DOB: 07/05/1951, 63 y.o.   MRN: [id number]   [facility] Progress Note    Assessment/ Plan:   1. ARF-non-oliguric on CKD 3: most likely sec to hypotension and ARB  (may have evolved from pre-renal state to ATN). Renal function continues to improve sluggishly with fair (charted) UOP- no indications for HD at this time. DC HCO3 gtt and switch to saline for the next 24 hrs.   2. Hypotension, BP better off anti-HTN Rx and s/p IVFs  3. Pancytopenia: seen by hematology and folate/B12 started- plasma cell dyscrasia screening pending. No evidence of MAHA/hemolysis. 4. Metabolic acidosis (from AKI and metformin- lactic acidosis) Corrected and will DC bicarb gtt.   Subjective:   Reports to be feeling well- some abdominal discomfort and flatulence after diarrhea yesterday (on precautions for C diff)   Objective:   BP 144/84  Pulse 103  Temp(Src) 97.6 F (36.4 C) (Oral)  Resp 20  Ht 5\' 10"  (1.778 m)  Wt 65.59 kg (144 lb 9.6 oz)  BMI 20.75 kg/m2  SpO2 97%  Intake/Output Summary (Last 24 hours) at 11/25/13 1004 Last data filed at 11/25/13 0839  Gross per 24 hour  Intake 1737.5 ml  Output    855 ml  Net  882.5 ml   Weight change: -0.71 kg (-1 lb 9 oz)  Physical Exam: BMW:[id number] resting in bed OZD:GUYQI RRR, normal s1 and S2 Resp:CTA bilaterally, no rales/rhonchi HKV:QQVZ, obese, TTP LLQ Ext:No LE edema  Imaging: US Renal Port  11/23/2013   CLINICAL DATA:  Acute renal failure.  EXAM: RENAL/URINARY TRACT ULTRASOUND COMPLETE  COMPARISON:  US RENAL dated 08/26/2012  FINDINGS: Right Kidney:  Length: 12.5 cm. Increased echogenicity. Multiple small simple appearing renal cysts. No hydronephrosis.  Left Kidney:  Length: 12.6 cm. Increased echogenicity. Multiple small simple appearing cysts. No hydronephrosis.  Bladder:  Collapsed around a Foley catheter.  IMPRESSION: 1.  No hydronephrosis. 2. Increased renal echogenicity, suggesting medical  renal disease.   Electronically Signed   By: Abigail Miyamoto M.D.   On: 11/23/2013 12:37    Labs: BMET  Recent Labs Lab 11/21/13 1350 11/21/13 2210 11/22/13 0340 11/22/13 1840 11/23/13 0935 11/24/13 0240 11/25/13 0838  NA 136* 136* 135* 132* 134* 134* 137  K 5.2 5.4* 5.0 4.8 4.6 4.8 4.0  CL 98 106 106 101 100 97 94*  CO2 15* 11* 13* 14* 17* 22 28  GLUCOSE 132* 118* 117* 338* 197* 252* 118*  BUN 93* 76* 77* 73* 70* 69* 63*  CREATININE 7.40* 6.19* 6.28* 6.18* 5.94* 5.65* 5.47*  CALCIUM 8.7 7.3* 7.3* 7.5* 7.4* 7.2* 7.2*  PHOS  --   --   --   --  3.9 3.8 3.9   CBC  Recent Labs Lab 11/21/13 1350 11/22/13 0340 11/22/13 1237 11/22/13 1840 11/23/13 0353 11/25/13 0500  WBC 5.4 3.0*  --  2.9* 3.0* 3.5*  NEUTROABS 4.6  --   --   --   --  2.5  HGB 8.5* 6.7*  --  8.6* 7.6* 7.4*  HCT 25.5* 19.5*  --  24.4* 21.7* 21.5*  MCV 110.9* 107.1*  --  103.8* 102.4* 103.4*  PLT 126* 85* 76* 85* 73* 72*    Medications:    . Chlorhexidine Gluconate Cloth  6 each Topical Q0600  . heparin  5,000 Units Subcutaneous 3 times per day  . insulin aspart  0-15 Units Subcutaneous TID WC  . insulin aspart  0-5 Units Subcutaneous QHS  .  insulin glargine  10 Units Subcutaneous Daily  . mupirocin ointment  1 application Nasal BID  . pantoprazole  40 mg Oral Q1200   Elmarie Shiley, MD 11/25/2013, 10:04 AM

## 2013-11-25 NOTE — Evaluation (Signed)
Physical Therapy Evaluation Patient Details Name: Micheal Goodman MRN: [id number] DOB: Jun 03, 1951 Today's Date: 11/25/2013 Time: 1245-8099 PT Time Calculation (min): 29 min  PT Assessment / Plan / Recommendation History of Present Illness  Micheal Goodman is a 63 y.o. male presenting on 11/21/2013 with has a past medical history of Hypertension; Diabetes mellitus without complication; Arthritis; Anemia; and Basal cell carcinoma whowas transferred from [facility] after presenting there with syncope and hypoglycemia. Pt denies any history of renal insufficiency though Scr 5/14 was 1.5. On presentation Scr was 7.4. He was briefly started on IV fluids, was also found to have anemia and thrombocytopenia, nephrology and hematology has been consulted. We'll function is slowly improving, work up for anemia is in progress  Clinical Impression  Pt admitted with syncope, hypoglycemia and acute renal failure. Pt currently with functional limitations due to the deficits listed below (see PT Problem List).  Pt will benefit from skilled PT to increase their independence and safety with mobility to allow discharge to the venue listed below. Recommend CIR as pt not safe to d/c home.    PT Assessment  Patient needs continued PT services    Follow Up Recommendations  CIR    Does the patient have the potential to tolerate intense rehabilitation      Barriers to Discharge Decreased caregiver support      Equipment Recommendations  Rolling walker with 5" wheels    Recommendations for Other Services Rehab consult   Frequency Min 3X/week    Precautions / Restrictions Precautions Precautions: Fall Restrictions Weight Bearing Restrictions: No   Pertinent Vitals/Pain No c/o      Mobility  Bed Mobility Overal bed mobility: Modified Independent General bed mobility comments: no rail and HOB flat Transfers Overall transfer level: Needs assistance Equipment used: Rolling walker (2  wheeled) Transfers: Sit to/from Stand Sit to Stand: Min assist General transfer comment: overall unsteadiness with transfers Ambulation/Gait Ambulation/Gait assistance: Min assist Ambulation Distance (Feet): 30 Feet Assistive device: Rolling walker (2 wheeled) Gait Pattern/deviations: Ataxic;Wide base of support Gait velocity: decreased Gait velocity interpretation: Below normal speed for age/gender    Exercises     PT Diagnosis: Difficulty walking;Abnormality of gait;Generalized weakness  PT Problem List: Decreased strength;Decreased activity tolerance;Decreased balance;Decreased mobility;Decreased coordination;Decreased knowledge of use of DME;Decreased safety awareness PT Treatment Interventions: DME instruction;Gait training;Functional mobility training;Therapeutic activities;Therapeutic exercise;Balance training;Neuromuscular re-education;Patient/family education     PT Goals(Current goals can be found in the care plan section) Acute Rehab PT Goals Patient Stated Goal: to go home PT Goal Formulation: With patient Potential to Achieve Goals: Good  Visit Information  Last PT Received On: 11/25/13 Assistance Needed: +1 History of Present Illness: Micheal Goodman is a 63 y.o. male presenting on 11/21/2013 with has a past medical history of Hypertension; Diabetes mellitus without complication; Arthritis; Anemia; and Basal cell carcinoma whowas transferred from [facility] after presenting there with syncope and hypoglycemia. Pt denies any history of renal insufficiency though Scr 5/14 was 1.5. On presentation Scr was 7.4. He was briefly started on IV fluids, was also found to have anemia and thrombocytopenia, nephrology and hematology has been consulted. We'll function is slowly improving, work up for anemia is in progress       Prior [place] expects to be discharged to:: Private residence Living Arrangements: Alone Available Help at  Discharge: Family;Available PRN/intermittently (brother) Type of Home: House Home Access: Level entry Home Layout: One level Home Equipment: Grab bars - toilet Prior  Function Level of Independence: Independent Communication Communication: No difficulties Dominant Hand: Right    Cognition  Cognition Arousal/Alertness: Awake/alert Behavior During Therapy: WFL for tasks assessed/performed Overall Cognitive Status: Within Functional Limits for tasks assessed    Extremity/Trunk Assessment Upper Extremity Assessment Upper Extremity Assessment: Generalized weakness (resting L hand tremor) Lower Extremity Assessment Lower Extremity Assessment: Generalized weakness Cervical / Trunk Assessment Cervical / Trunk Assessment: Kyphotic   Balance Balance Overall balance assessment: Needs assistance Sitting-balance support: Feet supported;No upper extremity supported Sitting balance-Leahy Scale: Fair Standing balance support: No upper extremity supported;During functional activity Standing balance-Leahy Scale: Poor Standing balance comment: initially stood without RW however pt. with poor standing balance and inability to amb without UE support General Comments General comments (skin integrity, edema, etc.): pt. with visible L beating horizontal nystagmus at rest (not provoked by head movements) Pt. denies symptoms.  End of Session PT - End of Session Equipment Utilized During Treatment: Gait belt Activity Tolerance: Patient tolerated treatment well;Patient limited by fatigue Patient left: in chair;with call bell/phone within reach Nurse Communication: Mobility status  GP     Faustino Congress K 11/25/2013, 11:39 AM  Laureen Abrahams, PT, DPT [phone]

## 2013-11-26 DIAGNOSIS — R5381 Other malaise: Secondary | ICD-10-CM

## 2013-11-26 DIAGNOSIS — N179 Acute kidney failure, unspecified: Secondary | ICD-10-CM

## 2013-11-26 LAB — RENAL FUNCTION PANEL
Albumin: 2.3 g/dL — ABNORMAL LOW (ref 3.5–5.2)
BUN: 59 mg/dL — AB (ref 6–23)
CHLORIDE: 96 meq/L (ref 96–112)
CO2: 25 meq/L (ref 19–32)
CREATININE: 5.39 mg/dL — AB (ref 0.50–1.35)
Calcium: 7.2 mg/dL — ABNORMAL LOW (ref 8.4–10.5)
GFR calc Af Amer: 12 mL/min — ABNORMAL LOW (ref >90)
GFR, EST NON AFRICAN AMERICAN: 10 mL/min — AB (ref >90)
Glucose, Bld: 209 mg/dL — ABNORMAL HIGH (ref 70–99)
POTASSIUM: 3.8 meq/L (ref 3.7–5.3)
Phosphorus: 3.4 mg/dL (ref 2.3–4.6)
Sodium: 137 mEq/L (ref 137–147)

## 2013-11-26 LAB — CBC
HEMATOCRIT: 20.8 % — AB (ref 39.0–52.0)
HEMOGLOBIN: 7.1 g/dL — AB (ref 13.0–17.0)
MCH: 35.3 pg — ABNORMAL HIGH (ref 26.0–34.0)
MCHC: 34.1 g/dL (ref 30.0–36.0)
MCV: 103.5 fL — AB (ref 78.0–100.0)
Platelets: 66 10*3/uL — ABNORMAL LOW (ref 150–400)
RBC: 2.01 MIL/uL — ABNORMAL LOW (ref 4.22–5.81)
RDW: 17.1 % — AB (ref 11.5–15.5)
WBC: 3.5 10*3/uL — ABNORMAL LOW (ref 4.0–10.5)

## 2013-11-26 LAB — GLUCOSE, CAPILLARY
GLUCOSE-CAPILLARY: 202 mg/dL — AB (ref 70–99)
Glucose-Capillary: 86 mg/dL (ref 70–99)
Glucose-Capillary: 111 mg/dL — ABNORMAL HIGH (ref 70–99)
Glucose-Capillary: 148 mg/dL — ABNORMAL HIGH (ref 70–99)

## 2013-11-26 LAB — ERYTHROPOIETIN: ERYTHROPOIETIN: 12.2 m[IU]/mL (ref 2.6–18.5)

## 2013-11-26 LAB — FOLATE: FOLATE: 3.9 ng/mL

## 2013-11-26 LAB — MAGNESIUM: Magnesium: 1.5 mg/dL (ref 1.5–2.5)

## 2013-11-26 MED ORDER — SODIUM CHLORIDE 0.9 % IV SOLN
INTRAVENOUS | Status: DC
  Administered 2013-11-26 – 2013-11-27 (×2): via INTRAVENOUS

## 2013-11-26 MED ORDER — CYANOCOBALAMIN 1000 MCG/ML IJ SOLN
1000.0000 ug | Freq: Once | INTRAMUSCULAR | Status: AC
  Administered 2013-11-26: 1000 ug via INTRAMUSCULAR
  Filled 2013-11-26: qty 1

## 2013-11-26 NOTE — Progress Notes (Signed)
Patient ID: Rodrecus Belsky, male   DOB: 08/24/1951, 63 y.o.   MRN: [id number]   [facility] Progress Note    Assessment/ Plan:   1. ARF-non-oliguric on CKD 3: most likely sec to hypotension and ARB (may have evolved from pre-renal state to ATN). Renal function continues to improve sluggishly with fair (charted) UOP- no indications for HD at this time. DC IVFs and monitor for continued recovery. No acute HD needs noted  2. Hypertension,Initially hypotensive on admission BP improved off anti-HTN Rx and s/p IVFs. Will start metoprolol tomorrow based on BP trends 3. Pancytopenia: seen by hematology and folate/B12 started- plasma cell dyscrasia screening negative and plans noted for bone marrow biopsy to evaluate for MDS if cell counts don't improve. No evidence of MAHA/hemolysis.  4. Deconditioning: ongoing evaluation for possible admission to CIR   Subjective:   Reports no complaints---"trying to get stronger by resting"   Objective:   BP 143/71  Pulse 99  Temp(Src) 97.9 F (36.6 C) (Oral)  Resp 18  Ht $R'5\' 10"'WL$  (1.778 m)  Wt 67.631 kg (149 lb 1.6 oz)  BMI 21.39 kg/m2  SpO2 94%  Intake/Output Summary (Last 24 hours) at 11/26/13 1054 Last data filed at 11/26/13 0942  Gross per 24 hour  Intake    511 ml  Output    660 ml  Net   -149 ml   Weight change: 2.041 kg (4 lb 8 oz)  Physical Exam: VZS:MOLMBEMLJQG resting in bed BEE:FEOFH RRR, S1 and S2 with ESM Resp:CTA bilaterally, no rales/rhonchi QRF:XJOI, flat, NT, BS normal Ext:No LE edema  Imaging: No results found.  Labs: BMET  Recent Labs Lab 11/21/13 2210 11/22/13 0340 11/22/13 1840 11/23/13 0935 11/24/13 0240 11/25/13 0838 11/25/13 1650  NA 136* 135* 132* 134* 134* 137 136*  K 5.4* 5.0 4.8 4.6 4.8 4.0 4.0  CL 106 106 101 100 97 94* 93*  CO2 11* 13* 14* 17* $Remov'22 28 29  'LiBDIS$ GLUCOSE 118* 117* 338* 197* 252* 118* 208*  BUN 76* 77* 73* 70* 69* 63* 61*  CREATININE 6.19* 6.28* 6.18* 5.94* 5.65* 5.47* 5.50*   CALCIUM 7.3* 7.3* 7.5* 7.4* 7.2* 7.2* 7.2*  PHOS  --   --   --  3.9 3.8 3.9 3.7   CBC  Recent Labs Lab 11/21/13 1350  11/22/13 1840 11/23/13 0353 11/25/13 0500 11/26/13 0500  WBC 5.4  < > 2.9* 3.0* 3.5* 3.5*  NEUTROABS 4.6  --   --   --  2.5  --   HGB 8.5*  < > 8.6* 7.6* 7.4* 7.1*  HCT 25.5*  < > 24.4* 21.7* 21.5* 20.8*  MCV 110.9*  < > 103.8* 102.4* 103.4* 103.5*  PLT 126*  < > 85* 73* 72* 66*  < > = values in this interval not displayed.  Medications:    . heparin  5,000 Units Subcutaneous 3 times per day  . insulin aspart  0-15 Units Subcutaneous TID WC  . insulin aspart  0-5 Units Subcutaneous QHS  . insulin glargine  10 Units Subcutaneous Daily  . pantoprazole  40 mg Oral Q1200   Elmarie Shiley, MD 11/26/2013, 10:54 AM

## 2013-11-26 NOTE — Progress Notes (Signed)
PATIENT DETAILS Name: Micheal Goodman Age: 63 y.o. Sex: male Date of Birth: December 09, 1950 Admit Date: 11/21/2013 Admitting Physician Elsie Stain, MD SUO:RVIFBP,PHKF HENRY, MD  Brief narrative: Micheal Goodman is a 63 y.o. male presenting on 11/21/2013 with has a past medical history of Hypertension; Diabetes mellitus without complication; Arthritis; Anemia; and Basal cell carcinoma whowas transferred from [facility] after presenting there with syncope and hypoglycemia. Pt denies any history of renal insufficiency though Scr 5/14 was 1.5. On presentation Scr was 7.4. He was briefly started on IV fluids, was also found to have anemia and thrombocytopenia, nephrology and hematology has been consulted. We'll function is slowly improving, work up for anemia is in progress  Subjective: No complaints, feels OK.  Assessment/Plan:  Acute renal failure/metabolic acidosis  - Patient was admitted, briefly required IV bicarbonate infusion, however now on normal saline 50 cc an hour. Workup underway, however current suspicion for ATN secondary to hypotension and ARB use. However given anemia and thrombocytopenia, multiple myeloma in the differential, SPEP pending. - Renal ultrasound on 3/15 negative for hydronephrosis, UA on 3/14 negative for proteinuria.  Anemia-thrombocytopenia  -On admission found to have new onset of anemia and thrombocytopenia, workup underway, and DIC panel negative, no evidence of microangiopathy therefore unlikely to be TTP, however ADAMTS13 pending. Did get 1 unit of PRBC on 3/14. Does have low vitamin B12 and folate levels, has been started on vitamin B12 and folate supplementation. -Given renal failure, SPEP pending, hematology following some suspicion for myelodysplastic syndrome as well. -Continue to monitor CBC periodically  Elevated d dimer  - duplex negative for DVT in legs   Hypoglycemia  - Patient was hypoglycemic on presentation - This is  resolved, likely secondary to worsening renal function and the use of Lantus another oral hypoglycemic medications  History of diabetes - Hemoglobin A1c 6.6, given renal failure and hypoglycemic presentation would allow some permissive hypoglycemia. - Started back on Lantus and SSI, CBGs controlled, continue to monitor closely  Hypertension  - Blood pressure currently controlled without the use of any antihypertensive medications. Continue to monitor and resume when able.  Hypothermia  - resolved- no infectious source found, likely secondary to hypoglycemia  Disposition: Remain inpatient- CIR Vs SNF  DVT Prophylaxis: Prophylactic Heparin - watch platelets  Code Status: Full code   Family Communication None at bedside  Procedures:  NonE  CONSULTS:  nephrology and hematology/oncology  Time spent 40 minutes-which includes 50% of the time with face-to-face with patient/ family and coordinating care related to the above assessment and plan.    MEDICATIONS: Scheduled Meds: . cyanocobalamin  1,000 mcg Intramuscular Once  . heparin  5,000 Units Subcutaneous 3 times per day  . insulin aspart  0-15 Units Subcutaneous TID WC  . insulin aspart  0-5 Units Subcutaneous QHS  . insulin glargine  10 Units Subcutaneous Daily  . pantoprazole  40 mg Oral Q1200   Continuous Infusions:   PRN Meds:.  Antibiotics: Anti-infectives   Start     Dose/Rate Route Frequency Ordered Stop   11/22/13 0600  piperacillin-tazobactam (ZOSYN) IVPB 2.25 g  Status:  Discontinued     2.25 g 100 mL/hr over 30 Minutes Intravenous 3 times per day 11/21/13 1904 11/22/13 0905   11/21/13 1615  piperacillin-tazobactam (ZOSYN) IVPB 3.375 g  Status:  Discontinued     3.375 g 12.5 mL/hr over 240 Minutes Intravenous  Once 11/21/13 1603 11/22/13 1511   11/21/13 1615  vancomycin (VANCOCIN) IVPB 1000 mg/200 mL premix     1,000 mg 200 mL/hr over 60 Minutes Intravenous  Once 11/21/13 1603 11/21/13 1738        PHYSICAL EXAM: Vital signs in last 24 hours: Filed Vitals:   11/25/13 0534 11/25/13 0643 11/25/13 1300 11/26/13 0638  BP:  144/84 103/63 143/71  Pulse:  103 65 99  Temp:  97.6 F (36.4 C) 97.7 F (36.5 C) 97.9 F (36.6 C)  TempSrc:  Oral Oral Oral  Resp:  $Remo'20 18 18  'TNqQU$ Height:      Weight: 65.59 kg (144 lb 9.6 oz)   67.631 kg (149 lb 1.6 oz)  SpO2:  97% 97% 94%    Weight change: 2.041 kg (4 lb 8 oz) Filed Weights   11/24/13 0500 11/25/13 0534 11/26/13 0638  Weight: 66.3 kg (146 lb 2.6 oz) 65.59 kg (144 lb 9.6 oz) 67.631 kg (149 lb 1.6 oz)   Body mass index is 21.39 kg/(m^2).   Gen Exam: Awake and alert with clear speech.   Neck: Supple, No JVD.   Chest: B/L Clear.   CVS: S1 S2 Regular, no murmurs.  Abdomen: soft, BS +, non tender, non distended.  Extremities: no edema, lower extremities warm to touch. Neurologic: Non Focal.   Skin: No Rash.   Wounds: N/A.   Intake/Output from previous day:  Intake/Output Summary (Last 24 hours) at 11/26/13 1115 Last data filed at 11/26/13 0942  Gross per 24 hour  Intake    511 ml  Output    660 ml  Net   -149 ml     LAB RESULTS: CBC  Recent Labs Lab 11/21/13 1350 11/22/13 0340 11/22/13 1237 11/22/13 1840 11/23/13 0353 11/25/13 0500 11/26/13 0500  WBC 5.4 3.0*  --  2.9* 3.0* 3.5* 3.5*  HGB 8.5* 6.7*  --  8.6* 7.6* 7.4* 7.1*  HCT 25.5* 19.5*  --  24.4* 21.7* 21.5* 20.8*  PLT 126* 85* 76* 85* 73* 72* 66*  MCV 110.9* 107.1*  --  103.8* 102.4* 103.4* 103.5*  MCH 37.0* 36.8*  --  36.6* 35.8* 35.6* 35.3*  MCHC 33.3 34.4  --  35.2 35.0 34.4 34.1  RDW 16.4* 17.4*  --  19.2* 18.8* 17.4* 17.1*  LYMPHSABS 0.4*  --   --   --   --  0.6*  --   MONOABS 0.4  --   --   --   --  0.3  --   EOSABS 0.0  --   --   --   --  0.1  --   BASOSABS 0.0  --   --   --   --  0.0  --     Chemistries   Recent Labs Lab 11/22/13 1840 11/23/13 0935 11/24/13 0240 11/25/13 0838 11/25/13 1650 11/26/13 0500  NA 132* 134* 134* 137 136*  --    K 4.8 4.6 4.8 4.0 4.0  --   CL 101 100 97 94* 93*  --   CO2 14* 17* $Remov'22 28 29  'DzPEkm$ --   GLUCOSE 338* 197* 252* 118* 208*  --   BUN 73* 70* 69* 63* 61*  --   CREATININE 6.18* 5.94* 5.65* 5.47* 5.50*  --   CALCIUM 7.5* 7.4* 7.2* 7.2* 7.2*  --   MG  --   --   --   --   --  1.5    CBG:  Recent Labs Lab 11/25/13 0753 11/25/13 1150 11/25/13 1651 11/25/13 2126 11/26/13 0753  GLUCAP  106* 202* 201* 163* 86    GFR Estimated Creatinine Clearance: 13.5 ml/min (by C-G formula based on Cr of 5.5).  Coagulation profile  Recent Labs Lab 11/21/13 1438 11/22/13 1237  INR 0.95 1.10    Cardiac Enzymes  Recent Labs Lab 11/21/13 1350  TROPONINI <0.30    No components found with this basename: POCBNP,  No results found for this basename: DDIMER,  in the last 72 hours No results found for this basename: HGBA1C,  in the last 72 hours No results found for this basename: CHOL, HDL, LDLCALC, TRIG, CHOLHDL, LDLDIRECT,  in the last 72 hours No results found for this basename: TSH, T4TOTAL, FREET3, T3FREE, THYROIDAB,  in the last 72 hours  Recent Labs  11/24/13 0240  RETICCTPCT 0.4   No results found for this basename: LIPASE, AMYLASE,  in the last 72 hours  Urine Studies No results found for this basename: UACOL, UAPR, USPG, UPH, UTP, UGL, UKET, UBIL, UHGB, UNIT, UROB, ULEU, UEPI, UWBC, URBC, UBAC, CAST, CRYS, UCOM, BILUA,  in the last 72 hours  MICROBIOLOGY: Recent Results (from the past 240 hour(s))  CULTURE, BLOOD (ROUTINE X 2)     Status: None   Collection Time    11/21/13  1:50 PM      Result Value Ref Range Status   Specimen Description BLOOD RIGHT ARM   Final   Special Requests     Final   Value: Immunocompromised BOTTLES DRAWN AEROBIC AND ANAEROBIC 5CC EACH   Culture  Setup Time     Final   Value: 11/21/2013 20:43     Performed at [facility]   Culture     Final   Value:        BLOOD CULTURE RECEIVED NO GROWTH TO DATE CULTURE WILL BE HELD FOR 5 DAYS BEFORE  ISSUING A FINAL NEGATIVE REPORT     Performed at [facility]   Report Status PENDING   Incomplete  CULTURE, BLOOD (ROUTINE X 2)     Status: None   Collection Time    11/21/13  2:15 PM      Result Value Ref Range Status   Specimen Description BLOOD RIGHT RADIAL   Final   Special Requests     Final   Value: Immunocompromised BOTTLES DRAWN AEROBIC AND ANAEROBIC 5CC EACH   Culture  Setup Time     Final   Value: 11/21/2013 20:43     Performed at [facility]   Culture     Final   Value:        BLOOD CULTURE RECEIVED NO GROWTH TO DATE CULTURE WILL BE HELD FOR 5 DAYS BEFORE ISSUING A FINAL NEGATIVE REPORT     Performed at [facility]   Report Status PENDING   Incomplete  MRSA PCR SCREENING     Status: Abnormal   Collection Time    11/21/13  6:21 PM      Result Value Ref Range Status   MRSA by PCR POSITIVE (*) NEGATIVE Final   Comment:            The GeneXpert MRSA Assay (FDA     approved for NASAL specimens     only), is one component of a     comprehensive MRSA colonization     surveillance program. It is not     intended to diagnose MRSA     infection nor to guide or     monitor treatment for  MRSA infections.     RESULT CALLED TO, READ BACK BY AND VERIFIED WITH:     Pincus Sanes RN 2225 11/21/13 A BROWNING    RADIOLOGY STUDIES/RESULTS: Ct Head Wo Contrast  11/21/2013   CLINICAL DATA:  Altered mental status  EXAM: CT HEAD WITHOUT CONTRAST  TECHNIQUE: Contiguous axial images were obtained from the base of the skull through the vertex without intravenous contrast. Study was obtained within 24 hr of patient's arrival at the emergency department.  COMPARISON:  None.  FINDINGS: There is moderate diffuse atrophy. There is no mass, hemorrhage, extra-axial fluid collection, or midline shift. There is mild small vessel disease in the centra semiovale bilaterally. Gray-white compartments elsewhere appear normal. There is no demonstrable acute infarct.  Bony calvarium  appears intact. The mastoid air cells are clear. There is opacification of multiple ethmoid sinuses bilaterally. There is mild mucosal thickening in both maxillary antra.  IMPRESSION: Moderate diffuse atrophy with mild periventricular small vessel disease. No intracranial mass, hemorrhage or acute appearing infarct. Multifocal paranasal sinus disease bilaterally.   Electronically Signed   By: Lowella Grip M.D.   On: 11/21/2013 15:49   US Renal Port  11/23/2013   CLINICAL DATA:  Acute renal failure.  EXAM: RENAL/URINARY TRACT ULTRASOUND COMPLETE  COMPARISON:  US RENAL dated 08/26/2012  FINDINGS: Right Kidney:  Length: 12.5 cm. Increased echogenicity. Multiple small simple appearing renal cysts. No hydronephrosis.  Left Kidney:  Length: 12.6 cm. Increased echogenicity. Multiple small simple appearing cysts. No hydronephrosis.  Bladder:  Collapsed around a Foley catheter.  IMPRESSION: 1.  No hydronephrosis. 2. Increased renal echogenicity, suggesting medical renal disease.   Electronically Signed   By: Abigail Miyamoto M.D.   On: 11/23/2013 12:37   Dg Chest Port 1 View  11/22/2013   CLINICAL DATA:  Atelectasis  EXAM: PORTABLE CHEST - 1 VIEW  COMPARISON:  Chest x-ray from yesterday  FINDINGS: The previously reported nodular density in the left mid lung faintly persists. It is ovoid, with the largest dimension measuring 93mm. There is no edema or lobar consolidation. No evidence of effusion (as permitted by exclusion of lateral costophrenic sulci sign) or pneumothorax. Normal heart size.  IMPRESSION: Faintly persistent 2 cm nodule in the left chest. Chest CT followup is recommended to exclude a nodule. Preferably, this would be accomplished on an outpatient basis - when there has been time for clearing of any inflammatory or infectious infiltrates.   Electronically Signed   By: Jorje Guild M.D.   On: 11/22/2013 08:17   Dg Chest Port 1 View  11/21/2013   CLINICAL DATA:  Weakness  EXAM: PORTABLE CHEST - 1  VIEW  COMPARISON:  None.  FINDINGS: Cardiac shadow is within normal limits. The lungs are well aerated bilaterally. There is a questionable nodular density in the left mid lung superimposed over the anterior aspect of the third and fourth ribs. It measures approximately 17 mm. This may be related to overlying artifact. Short-term followup to assess for resolution is recommended.  IMPRESSION: Questionable nodular density in the left mid lung. Short-term followup is recommended. If the lesion persists, nonemergent CT of the chest is recommended for further evaluation.   Electronically Signed   By: Inez Catalina M.D.   On: 11/21/2013 15:24    Tomie Spizzirri A, MD  [facility] Pager:[phone]  If 7PM-7AM, please contact night-coverage [email or web address] Password [email or web address] 11/26/2013, 11:15 AM   LOS: 5 days

## 2013-11-26 NOTE — Consult Note (Signed)
Physical Medicine and Rehabilitation Consult  Reason for Consult: Deconditioning Referring Physician: Dr. Sloan Leiter   HPI: Micheal Goodman is a 63 y.o. male with history of DM with retinopathy, HTN, h/o malaise x few weeks with poor po intake who was admitted on 11/21/13 after being  found down by family. He was noted to have decreased LOC, with BS-32 and hypothermia with T-94. He was treated with warm IVF and found to have renal failure with Cr-7.4 as well as anemia with thrombocytopenia.  Foley difficult to place and Dr. Risa Grill consulted for assistance. Patient with hypospadias with distal urethral stricture and distal urethra dilated and foley placed with 300-450 cc UOP. Nephrology consulted for input on metabolic acidosis with renal failure and work up ordered for ARF likely due to hypotension on ARB.  Renal u/s with evidence of medical renal disease. CCT negative for acute changes. BLE dopplers negative for DVT. SPEP normal. He was treated with bicarb and blood smear without evidence of TTP but pancytopenia noted. Dr. Alen Blew consulted for input and felt that macrocytic anemia in setting of ARF like due to vitamin deficiency and possibly myelodysplastic syndrome. To continue G25 and folic acid supplements and follow up on outpatient basis for bone marrow biopsy if counts without improvement. Renal failure slowly improving with improvement in UOP and IVF change to NS. PT evaluation done yesterday and patient noted to be deconditioned. MD, PT recommending CIR for progression.    Review of Systems  HENT: Negative for hearing loss.   Eyes: Negative for blurred vision and double vision.  Respiratory: Negative for cough and wheezing.   Cardiovascular: Negative for chest pain and palpitations.  Gastrointestinal: Negative for heartburn and nausea.  Genitourinary: Negative for urgency and frequency.  Musculoskeletal: Negative for back pain and myalgias.  Neurological: Negative for dizziness, focal  weakness and headaches.    Past Medical History  Diagnosis Date  . Hypertension   . Diabetes mellitus without complication   . Arthritis   . Wears glasses   . Anemia   . Basal cell carcinoma    Past Surgical History  Procedure Laterality Date  . Retinal detachment surgery  2004    left  . Skin split graft N/A 01/13/2013    Procedure: SPLIT THICKNESS SKIN GRAFT OF THE FOREHEAD;  Surgeon: Theodoro Kos, DO;  Location: [place] [facility];  Service: Plastics;  Laterality: N/A;  Forehead;  Donor site right thigh. Split thickness skin graft   History reviewed. No pertinent family history.  Social History:  Lives alone. Retired from [employer]. He reports that he has never smoked. He does not have any smokeless tobacco history on file. He reports that he does not drink alcohol or use illicit drugs.  Allergies  Allergen Reactions  . Altace [Ramipril]     weakness   Medications Prior to Admission  Medication Sig Dispense Refill  . amLODipine (NORVASC) 5 MG tablet Take 5 mg by mouth daily.      Marland Kitchen aspirin 81 MG tablet Take 81 mg by mouth daily.      Marland Kitchen atorvastatin (LIPITOR) 20 MG tablet Take 20 mg by mouth daily.      . insulin glargine (LANTUS) 100 UNIT/ML injection Inject 10 Units into the skin daily.      Marland Kitchen losartan (COZAAR) 50 MG tablet Take 50 mg by mouth daily.      . metFORMIN (GLUCOPHAGE) 1000 MG tablet Take 1,000 mg by mouth 2 (two) times daily with a  meal.      . metoprolol succinate (TOPROL-XL) 50 MG 24 hr tablet Take 50 mg by mouth daily. Take with or immediately following a meal.      . Multiple Vitamins-Iron (MULTIVITAMINS WITH IRON) TABS Take 1 tablet by mouth daily.      . pioglitazone (ACTOS) 45 MG tablet Take 45 mg by mouth daily.        Home: Home Living Family/patient expects to be discharged to:: Private residence Living Arrangements: Alone Available Help at Discharge: Family;Available PRN/intermittently (brother) Type of Home: House Home  Access: Level entry Home Layout: One level Home Equipment: Grab bars - toilet  Functional History:   Functional Status:  Mobility:     Ambulation/Gait Ambulation Distance (Feet): 30 Feet Gait velocity: decreased    ADL:    Cognition: Cognition Overall Cognitive Status: Within Functional Limits for tasks assessed Orientation Level: Oriented X4 Cognition Arousal/Alertness: Awake/alert Behavior During Therapy: WFL for tasks assessed/performed Overall Cognitive Status: Within Functional Limits for tasks assessed  Blood pressure 143/71, pulse 99, temperature 97.9 F (36.6 C), temperature source Oral, resp. rate 18, height $RemoveBe'5\' 10"'pgtLooFeK$  (1.778 m), weight 67.631 kg (149 lb 1.6 oz), SpO2 94.00%. Physical Exam  Nursing note and vitals reviewed. Constitutional: He is oriented to person, place, and time. He appears well-developed and well-nourished.  HENT:  Head: Normocephalic and atraumatic.  Well healed old scar mid-forehead.   Eyes: Conjunctivae are normal. Pupils are equal, round, and reactive to light.  Neck: Normal range of motion. Neck supple.  Cardiovascular: Normal rate and regular rhythm.   Respiratory: Effort normal and breath sounds normal.  GI: Soft. Bowel sounds are normal. He exhibits no distension. There is no tenderness.  Musculoskeletal: He exhibits no edema and no tenderness.  Well healed old scar left forearm. No focal pain  Neurological: He is alert and oriented to person, place, and time. He displays normal reflexes. He exhibits normal muscle tone.  Slightly decreased vision. Intentional tremor both UE's. Strength UE's 4/5 prox to distal. LE's 4-HF to 4/5 distally. Decreased LT in both feet  Skin: Skin is warm and dry.  Psychiatric: He has a normal mood and affect. His behavior is normal. Judgment and thought content normal.    Results for orders placed during the hospital encounter of 11/21/13 (from the past 24 hour(s))  GLUCOSE, CAPILLARY     Status: Abnormal    Collection Time    11/25/13 11:50 AM      Result Value Ref Range   Glucose-Capillary 202 (*) 70 - 99 mg/dL  RENAL FUNCTION PANEL     Status: Abnormal   Collection Time    11/25/13  4:50 PM      Result Value Ref Range   Sodium 136 (*) 137 - 147 mEq/L   Potassium 4.0  3.7 - 5.3 mEq/L   Chloride 93 (*) 96 - 112 mEq/L   CO2 29  19 - 32 mEq/L   Glucose, Bld 208 (*) 70 - 99 mg/dL   BUN 61 (*) 6 - 23 mg/dL   Creatinine, Ser 5.50 (*) 0.50 - 1.35 mg/dL   Calcium 7.2 (*) 8.4 - 10.5 mg/dL   Phosphorus 3.7  2.3 - 4.6 mg/dL   Albumin 2.5 (*) 3.5 - 5.2 g/dL   GFR calc non Af Amer 10 (*) >90 mL/min   GFR calc Af Amer 12 (*) >90 mL/min  GLUCOSE, CAPILLARY     Status: Abnormal   Collection Time    11/25/13  4:51 PM  Result Value Ref Range   Glucose-Capillary 201 (*) 70 - 99 mg/dL  GLUCOSE, CAPILLARY     Status: Abnormal   Collection Time    11/25/13  9:26 PM      Result Value Ref Range   Glucose-Capillary 163 (*) 70 - 99 mg/dL   Comment 1 Notify RN     Comment 2 Documented in Chart    MAGNESIUM     Status: None   Collection Time    11/26/13  5:00 AM      Result Value Ref Range   Magnesium 1.5  1.5 - 2.5 mg/dL  CBC     Status: Abnormal   Collection Time    11/26/13  5:00 AM      Result Value Ref Range   WBC 3.5 (*) 4.0 - 10.5 K/uL   RBC 2.01 (*) 4.22 - 5.81 MIL/uL   Hemoglobin 7.1 (*) 13.0 - 17.0 g/dL   HCT 20.8 (*) 39.0 - 52.0 %   MCV 103.5 (*) 78.0 - 100.0 fL   MCH 35.3 (*) 26.0 - 34.0 pg   MCHC 34.1  30.0 - 36.0 g/dL   RDW 17.1 (*) 11.5 - 15.5 %   Platelets 66 (*) 150 - 400 K/uL  GLUCOSE, CAPILLARY     Status: None   Collection Time    11/26/13  7:53 AM      Result Value Ref Range   Glucose-Capillary 86  70 - 99 mg/dL   No results found.  Assessment/Plan: Diagnosis: deconditioning related to acute renal failure and multiple medical issues 1. Does the need for close, 24 hr/day medical supervision in concert with the patient's rehab needs make it unreasonable for this  patient to be served in a less intensive setting? Yes 2. Co-Morbidities requiring supervision/potential complications: dm, anemia 3. Due to bladder management, bowel management, safety, skin/wound care, disease management, medication administration, pain management and patient education, does the patient require 24 hr/day rehab nursing? Yes 4. Does the patient require coordinated care of a physician, rehab nurse, PT (1-2 hrs/day, 5 days/week) and OT (1-2 hrs/day, 5 days/week) to address physical and functional deficits in the context of the above medical diagnosis(es)? Yes Addressing deficits in the following areas: balance, endurance, locomotion, strength, transferring, bowel/bladder control, bathing, dressing, feeding, grooming, toileting and psychosocial support 5. Can the patient actively participate in an intensive therapy program of at least 3 hrs of therapy per day at least 5 days per week? Yes 6. The potential for patient to make measurable gains while on inpatient rehab is excellent 7. Anticipated functional outcomes upon discharge from inpatient rehab are mod I to supervision with PT, mod I to supervision with OT, n/a with SLP. 8. Estimated rehab length of stay to reach the above functional goals is: 7-10 days 9. Does the patient have adequate social supports to accommodate these discharge functional goals? Yes 10. Anticipated D/C setting: Home 11. Anticipated post D/C treatments: [place] therapy 12. Overall Rehab/Functional Prognosis: excellent  RECOMMENDATIONS: This patient's condition is appropriate for continued rehabilitative care in the following setting: CIR Patient has agreed to participate in recommended program. Yes Note that insurance prior authorization may be required for reimbursement for recommended care.  Comment: Rehab Admissions Coordinator to follow up.  Thanks,  Meredith Staggers, MD, Mellody Drown     11/26/2013

## 2013-11-26 NOTE — Progress Notes (Signed)
Rehab admissions - I met with pt and explained the purpose of acute inpatient rehab. He is interested in pursuing this and questions were answered. Informational brochures were given. Noted that pt has [employer] [place] and will need OT notes as well to submit to insurance for pre-authorization.  Pt would like me to open the case with [employer] of [place] and will keep the pt/medical team posted as we wait for insurance pre-authorization. I will open the case with [employer] in the am.   Updated social worker Marshell Levan and Stanton Kidney, case manager as well.    Please call me with any questions. Thanks.  Nanetta Batty, PT Rehabilitation Admissions Coordinator [phone]

## 2013-11-26 NOTE — Progress Notes (Signed)
IP PROGRESS NOTE  Subjective:   No major events noted at this time.     Lab Results:  Recent Labs  11/25/13 0500 11/26/13 0500  WBC 3.5* 3.5*  HGB 7.4* 7.1*  HCT 21.5* 20.8*  PLT 72* 66*    BMET  Recent Labs  11/25/13 0838 11/25/13 1650  NA 137 136*  K 4.0 4.0  CL 94* 93*  CO2 28 29  GLUCOSE 118* 208*  BUN 63* 61*  CREATININE 5.47* 5.50*  CALCIUM 7.2* 7.2*   Results for Micheal Goodman, Micheal Goodman (MRN [id number]) as of 11/26/2013 07:38  Ref. Range 11/22/2013 10:49  Total Protein ELP Latest Range: 6.0-8.3 g/dL 3.3 (L)  Albumin ELP Latest Range: 55.8-66.1 % 58.8  Alpha-1-Globulin Latest Range: 2.9-4.9 % 6.0 (H)  Alpha-2-Globulin Latest Range: 7.1-11.8 % 14.4 (H)  Beta Globulin Latest Range: 4.7-7.2 % 5.7  Beta 2 Latest Range: 3.2-6.5 % 4.5  Gamma Globulin Latest Range: 11.1-18.8 % 10.6 (L)  M-SPIKE, % No range found NOT DETECTED  SPE Interp. No range found (NOTE)  Comment No range found (NOTE)    Medications: I have reviewed the patient's current medications.  Assessment/Plan:  63 year old gentleman with the following issues:  1. Macrocytic anemia in the setting of acute renal failure. The differential diagnosis includes a vitamin B12 and folate deficiency. I doubt plasma cell disorder given the fact that he has a normal SPEP without any suggestions of lytic bone lesions or hypercalcemia. I still favor vitamin deficiency and possibly myelodysplastic syndrome is a consideration. I suggest continuing with vitamin P49 and folic acid supplementation while he is in the hospital. Upon discharge, I will arrange for him to have a followup at the [facility] and if his counts do not improve within the next few weeks bone marrow biopsy would be recommended.  2. Thrombocytopenia: There is no evidence of microangiopathy to suggest TTP/HUS. I doubt that this is DIC either or HIT. MDS is still a consideration. Could also be related to his acute illness and should recover  soon.  3. Renal failure: His creatinine slowly improved and his urine output remained adequate. There is no indication for hemodialysis per nephrology.     LOS: 5 days   [facility] 11/26/2013, 7:41 AM

## 2013-11-26 NOTE — Clinical Social Work Psychosocial (Signed)
Clinical Social Work Department BRIEF PSYCHOSOCIAL ASSESSMENT 11/26/2013  Patient:  [facility]     Account Number:  [id number]     Admit date:  11/21/2013  Clinical Social Worker:  Lovey Newcomer  Date/Time:  11/26/2013 05:15 PM  Referred by:  Physician  Date Referred:  11/26/2013 Referred for  SNF Placement   Other Referral:   Interview type:  Patient Other interview type:   Patient alert and oriented at time of assessment.    PSYCHOSOCIAL DATA Living Status:  ALONE Admitted from facility:   Level of care:   Primary support name:  Freddie Primary support relationship to patient:  SIBLING Degree of support available:   Support is good.    CURRENT CONCERNS Current Concerns  Post-Acute Placement   Other Concerns:    SOCIAL WORK ASSESSMENT / PLAN CSW met with patient at bedside to complete assessment. CSW explained that PT has recommended CIR for patient. Per CIR rehab admission coordinator, CIR is considering patient and has started [facility] authorization process. CSW explained that patient needs SNF backup plan in case he is not admitted to CIR. Patient is agreeable to SNF backup plan and states that he will want to be placed in [facility] but does not have a specific preference at this time. Patient states that his main support is his brother Annalee Genta who also lives in the area. Patient seems indifferent about SNF placement but believes he needs rehab before returning home.   Assessment/plan status:  Psychosocial Support/Ongoing Assessment of Needs Other assessment/ plan:   Complete FL2, Fax, PASRR   Information/referral to community resources:   CSW contact information and SNF list given to patient.    PATIENT'S/FAMILY'S RESPONSE TO PLAN OF CARE: Patient agreeable to SNF backup plan. Patient was pleasant, appropriate, and appreciative of CSW contact. Rehab admissions coordinator with CIR states that patient is a good candidate for CIR and [employer]  authorization has been started. CSW will continue to follow for DC needs.       Liz Beach, [place], [place], [phone]

## 2013-11-26 NOTE — Clinical Social Work Placement (Signed)
Clinical Social Work Department CLINICAL SOCIAL WORK PLACEMENT NOTE 11/26/2013  Patient:  [facility]  Account Number:  [id number] Admit date:  11/21/2013  Clinical Social Worker:  Lovey Newcomer  Date/time:  11/26/2013 08:47 PM  Clinical Social Work is seeking post-discharge placement for this patient at the following level of care:   [place]   (*CSW will update this form in Epic as items are completed)   11/26/2013  Patient/family provided with [place] Department of Clinical Social Work's list of facilities offering this level of care within the geographic area requested by the patient (or if unable, by the patient's family).  11/26/2013  Patient/family informed of their freedom to choose among providers that offer the needed level of care, that participate in Medicare, Medicaid or managed care program needed by the patient, have an available bed and are willing to accept the patient.  11/26/2013  Patient/family informed of [facility]' ownership interest in [facility], as well as of the fact that they are under no obligation to receive care at this facility.  PASARR submitted to EDS on 11/26/2013 PASARR number received from EDS on 11/26/2013  FL2 transmitted to all facilities in geographic area requested by pt/family on  11/26/2013 FL2 transmitted to all facilities within larger geographic area on   Patient informed that his/her managed care company has contracts with or will negotiate with  certain facilities, including the following:     Patient/family informed of bed offers received:   Patient chooses bed at  Physician recommends and patient chooses bed at    Patient to be transferred to  on   Patient to be transferred to facility by   The following physician request were entered in Epic:   Additional Comments:    Liz Beach, [place], [place], [phone]

## 2013-11-27 ENCOUNTER — Inpatient Hospital Stay (HOSPITAL_COMMUNITY): Payer: BC Managed Care – PPO

## 2013-11-27 DIAGNOSIS — C44300 Unspecified malignant neoplasm of skin of unspecified part of face: Secondary | ICD-10-CM

## 2013-11-27 LAB — CULTURE, BLOOD (ROUTINE X 2)
CULTURE: NO GROWTH
Culture: NO GROWTH

## 2013-11-27 LAB — BASIC METABOLIC PANEL
BUN: 60 mg/dL — AB (ref 6–23)
CALCIUM: 7.3 mg/dL — AB (ref 8.4–10.5)
CO2: 26 meq/L (ref 19–32)
Chloride: 95 mEq/L — ABNORMAL LOW (ref 96–112)
Creatinine, Ser: 5.21 mg/dL — ABNORMAL HIGH (ref 0.50–1.35)
GFR calc Af Amer: 12 mL/min — ABNORMAL LOW (ref >90)
GFR calc non Af Amer: 11 mL/min — ABNORMAL LOW (ref >90)
Glucose, Bld: 142 mg/dL — ABNORMAL HIGH (ref 70–99)
Potassium: 4 mEq/L (ref 3.7–5.3)
Sodium: 136 mEq/L — ABNORMAL LOW (ref 137–147)

## 2013-11-27 LAB — MAGNESIUM: MAGNESIUM: 1.4 mg/dL — AB (ref 1.5–2.5)

## 2013-11-27 LAB — GLUCOSE, CAPILLARY
GLUCOSE-CAPILLARY: 128 mg/dL — AB (ref 70–99)
GLUCOSE-CAPILLARY: 109 mg/dL — AB (ref 70–99)
GLUCOSE-CAPILLARY: 175 mg/dL — AB (ref 70–99)
Glucose-Capillary: 138 mg/dL — ABNORMAL HIGH (ref 70–99)

## 2013-11-27 LAB — CBC
HCT: 19.5 % — ABNORMAL LOW (ref 39.0–52.0)
Hemoglobin: 6.5 g/dL — CL (ref 13.0–17.0)
MCH: 35.3 pg — AB (ref 26.0–34.0)
MCHC: 33.3 g/dL (ref 30.0–36.0)
MCV: 106 fL — ABNORMAL HIGH (ref 78.0–100.0)
PLATELETS: 66 10*3/uL — AB (ref 150–400)
RBC: 1.84 MIL/uL — ABNORMAL LOW (ref 4.22–5.81)
RDW: 17.7 % — ABNORMAL HIGH (ref 11.5–15.5)
WBC: 3.6 10*3/uL — AB (ref 4.0–10.5)

## 2013-11-27 LAB — PREPARE RBC (CROSSMATCH)

## 2013-11-27 MED ORDER — FUROSEMIDE 10 MG/ML IJ SOLN
40.0000 mg | Freq: Once | INTRAMUSCULAR | Status: AC
  Administered 2013-11-28: 40 mg via INTRAVENOUS
  Filled 2013-11-27 (×2): qty 4

## 2013-11-27 MED ORDER — MAGNESIUM SULFATE 40 MG/ML IJ SOLN
2.0000 g | Freq: Once | INTRAMUSCULAR | Status: AC
  Administered 2013-11-27: 2 g via INTRAVENOUS
  Filled 2013-11-27: qty 50

## 2013-11-27 MED ORDER — CYANOCOBALAMIN 1000 MCG/ML IJ SOLN
1000.0000 ug | Freq: Every day | INTRAMUSCULAR | Status: DC
  Administered 2013-11-27 – 2013-12-01 (×5): 1000 ug via INTRAMUSCULAR
  Filled 2013-11-27 (×5): qty 1

## 2013-11-27 MED ORDER — FOLIC ACID 1 MG PO TABS
1.0000 mg | ORAL_TABLET | Freq: Every day | ORAL | Status: DC
  Administered 2013-11-27 – 2013-12-01 (×5): 1 mg via ORAL
  Filled 2013-11-27 (×5): qty 1

## 2013-11-27 NOTE — Progress Notes (Signed)
CRITICAL VALUE ALERT  Critical value received:  Hgb 6.5  Date of notification: 11/27/2013  Time of notification:  2947  Critical value read back: YES  Nurse who received alert:  Naaman Plummer  MD notified (1st page):  [facility], face to face conversation   Time of first page:  1252  MD notified (2nd page):  Time of second page:  Responding MD:    Time MD responded:

## 2013-11-27 NOTE — Evaluation (Signed)
Occupational Therapy Evaluation Patient Details Name: Micheal Goodman MRN: [id number] DOB: 1951/08/12 Today's Date: 11/27/2013 Time: 5597-4163 OT Time Calculation (min): 21 min  OT Assessment / Plan / Recommendation History of present illness Micheal Goodman is a 63 y.o. male presenting on 11/21/2013 with has a past medical history of Hypertension; Diabetes mellitus without complication; Arthritis; Anemia; and Basal cell carcinoma whowas transferred from [facility] after presenting there with syncope and hypoglycemia. Pt denies any history of renal insufficiency though Scr 5/14 was 1.5. On presentation Scr was 7.4. He was briefly started on IV fluids, was also found to have anemia and thrombocytopenia, nephrology and hematology has been consulted. We'll function is slowly improving, work up for anemia is in progress   Clinical Impression   Pt demos decline in function with ADLs and ADL mobility safety with decreased strength, balance and endurance. Pt planning to d/c to [facility] for short term rehab prior to returning home. No further acute OT services indicated at this time. Will defer further OT intervention to SNF    OT Assessment  All further OT needs can be met in the next venue of care    Follow Up Recommendations  SNF;Supervision/Assistance - 24 hour    Barriers to Discharge Decreased caregiver support pt lives ta home alone  Equipment Recommendations  None recommended by OT;Other (comment) (TBD at next venue of care)    Recommendations for Other Services    Frequency  Min 2X/week    Precautions / Restrictions Precautions Precautions: Fall Restrictions Weight Bearing Restrictions: No   Pertinent Vitals/Pain No c/o pain    ADL  Grooming: Performed;Wash/dry hands;Wash/dry face;Min guard Where Assessed - Grooming: Supported standing Upper Body Bathing: Simulated;Supervision/safety;Set up Where Assessed - Upper Body Bathing: Unsupported sitting Lower Body  Bathing: Simulated;Moderate assistance Upper Body Dressing: Performed;Supervision/safety;Set up Where Assessed - Upper Body Dressing: Unsupported sitting Lower Body Dressing: Performed;Moderate assistance Toilet Transfer: Performed;Minimal assistance Toilet Transfer Method: Sit to stand Toilet Transfer Equipment: Regular height toilet;Grab bars Toileting - Clothing Manipulation and Hygiene: Performed;Minimal assistance Where Assessed - Toileting Clothing Manipulation and Hygiene: Standing;Lean right and/or left Tub/Shower Transfer Method: Not assessed Equipment Used: Gait belt;Rolling walker Transfers/Ambulation Related to ADLs: pt unsteady, cues for safety/correct hand placement    OT Diagnosis: Generalized weakness  OT Problem List: Decreased strength;Decreased knowledge of use of DME or AE;Impaired balance (sitting and/or standing);Decreased safety awareness;Decreased activity tolerance OT Treatment Interventions: Self-care/ADL training;Patient/family education;Neuromuscular education;Balance training;Therapeutic exercise;Therapeutic activities;DME and/or AE instruction   OT Goals(Current goals can be found in the care plan section) Acute Rehab OT Goals Patient Stated Goal: to go home after rehab OT Goal Formulation: With patient  Visit Information  Last OT Received On: 11/27/13 Assistance Needed: +1 History of Present Illness: Micheal Goodman is a 63 y.o. male presenting on 11/21/2013 with has a past medical history of Hypertension; Diabetes mellitus without complication; Arthritis; Anemia; and Basal cell carcinoma whowas transferred from [facility] after presenting there with syncope and hypoglycemia. Pt denies any history of renal insufficiency though Scr 5/14 was 1.5. On presentation Scr was 7.4. He was briefly started on IV fluids, was also found to have anemia and thrombocytopenia, nephrology and hematology has been consulted. We'll function is slowly improving, work up for  anemia is in progress       Prior [place] expects to be discharged to:: Private residence Living Arrangements: Alone Available Help at Discharge: Family;Available PRN/intermittently Type of Home:  House Home Access: Level entry Home Layout: One level Home Equipment: Grab bars - toilet Prior Function Level of Independence: Independent Communication Communication: No difficulties Dominant Hand: Right         Vision/Perception Vision - History Baseline Vision: Wears glasses all the time Patient Visual Report: No change from baseline Perception Perception: Within Functional Limits   Cognition  Cognition Arousal/Alertness: Awake/alert Behavior During Therapy: WFL for tasks assessed/performed Overall Cognitive Status: Within Functional Limits for tasks assessed    Extremity/Trunk Assessment Upper Extremity Assessment Upper Extremity Assessment: Generalized weakness Lower Extremity Assessment Lower Extremity Assessment: Defer to PT evaluation Cervical / Trunk Assessment Cervical / Trunk Assessment: Kyphotic     Mobility Bed Mobility Overal bed mobility: Modified Independent Transfers Overall transfer level: Needs assistance Equipment used: Rolling walker (2 wheeled) Transfers: Sit to/from Stand Sit to Stand: Min assist General transfer comment: pt unsteady, cues for safety/correct hand placement          Balance Balance Overall balance assessment: Needs assistance Sitting-balance support: No upper extremity supported;Feet supported Sitting balance-Leahy Scale: Good Standing balance support: Single extremity supported;Bilateral upper extremity supported;During functional activity Standing balance-Leahy Scale: Fair   End of Session OT - End of Session Equipment Utilized During Treatment: Gait belt;Rolling walker Activity Tolerance: Patient tolerated treatment well Patient left: in bed;with call bell/phone within reach  GO      Britt Bottom 11/27/2013, 1:19 PM

## 2013-11-27 NOTE — Progress Notes (Addendum)
Patient ID: Kveon Casanas, male   DOB: 1951/04/13, 63 y.o.   MRN: [id number]  [facility] Progress Note    Assessment/ Plan:   1. ARF-non-oliguric on CKD 3: Likely ATN from hypotension/ARB. No acute HD needs and continues to show slow but continued renal recovery. No changes to treatment plan for now.  2. Hypertension,Initially hypotensive on admission BP improved off anti-HTN Rx and s/p IVFs. BPs appear acceptable off treatment 3. Pancytopenia: seen by hematology and folate/B12 started- plasma cell dyscrasia screening negative and plans noted for bone marrow biopsy to evaluate for MDS if cell counts don't improve. No evidence of MAHA/hemolysis.  4. Deconditioning: upon evaluation by CIR/PT---recommendations made for SNF admission    Subjective:   Reports to be feeling better and trying to recover. Transient low BP and tachycardia last PM- IVFs started at low rate    Objective:   BP 105/60  Pulse 104  Temp(Src) 98.2 F (36.8 C) (Oral)  Resp 18  Ht $R'5\' 10"'eR$  (1.778 m)  Wt 67.9 kg (149 lb 11.1 oz)  BMI 21.48 kg/m2  SpO2 95%  Intake/Output Summary (Last 24 hours) at 11/27/13 1034 Last data filed at 11/27/13 0524  Gross per 24 hour  Intake    580 ml  Output    725 ml  Net   -145 ml   Weight change: 0.269 kg (9.5 oz)  Physical Exam: HGD:JMEQASTMHDQ resting in bed QIW:LNLGX regular tachycardia, normal S1 and S2 Resp:CTA bilaterally, no rales/rhonchi QJJ:HERD, obese, NT, BS normal Ext:NO LE edema  Imaging: No results found.  Labs: BMET  Recent Labs Lab 11/22/13 0340 11/22/13 1840 11/23/13 0935 11/24/13 0240 11/25/13 0838 11/25/13 1650 11/26/13 1240  NA 135* 132* 134* 134* 137 136* 137  K 5.0 4.8 4.6 4.8 4.0 4.0 3.8  CL 106 101 100 97 94* 93* 96  CO2 13* 14* 17* $Remov'22 28 29 25  'BsysaO$ GLUCOSE 117* 338* 197* 252* 118* 208* 209*  BUN 77* 73* 70* 69* 63* 61* 59*  CREATININE 6.28* 6.18* 5.94* 5.65* 5.47* 5.50* 5.39*  CALCIUM 7.3* 7.5* 7.4* 7.2* 7.2* 7.2* 7.2*   PHOS  --   --  3.9 3.8 3.9 3.7 3.4   CBC  Recent Labs Lab 11/21/13 1350  11/22/13 1840 11/23/13 0353 11/25/13 0500 11/26/13 0500  WBC 5.4  < > 2.9* 3.0* 3.5* 3.5*  NEUTROABS 4.6  --   --   --  2.5  --   HGB 8.5*  < > 8.6* 7.6* 7.4* 7.1*  HCT 25.5*  < > 24.4* 21.7* 21.5* 20.8*  MCV 110.9*  < > 103.8* 102.4* 103.4* 103.5*  PLT 126*  < > 85* 73* 72* 66*  < > = values in this interval not displayed.  Medications:    . heparin  5,000 Units Subcutaneous 3 times per day  . insulin aspart  0-15 Units Subcutaneous TID WC  . insulin aspart  0-5 Units Subcutaneous QHS  . insulin glargine  10 Units Subcutaneous Daily  . magnesium sulfate 1 - 4 g bolus IVPB  2 g Intravenous Once  . pantoprazole  40 mg Oral Q1200   Elmarie Shiley, MD 11/27/2013, 10:34 AM

## 2013-11-27 NOTE — Progress Notes (Signed)
PATIENT DETAILS Name: Micheal Goodman Age: 63 y.o. Sex: male Date of Birth: 01-29-1951 Admit Date: 11/21/2013 Admitting Physician Elsie Stain, MD ZOX:WRUEAV,WUJW HENRY, MD  Brief narrative: Micheal Goodman is a 63 y.o. male presenting on 11/21/2013 with has a past medical history of Hypertension; Diabetes mellitus without complication; Arthritis; Anemia; and Basal cell carcinoma whowas transferred from [facility] after presenting there with syncope and hypoglycemia. Pt denies any history of renal insufficiency though Scr 5/14 was 1.5. On presentation Scr was 7.4. He was briefly started on IV fluids, was also found to have anemia and thrombocytopenia, nephrology and hematology has been consulted. We'll function is slowly improving, work up for anemia is in progress  Subjective: Denies any complaints, hemoglobin is 6.7.  Assessment/Plan:  Acute renal failure/metabolic acidosis  - Patient was admitted, briefly required IV bicarbonate infusion. Workup underway, however current suspicion for ATN secondary to hypotension and ARB use. However given anemia and thrombocytopenia, multiple myeloma in the differential, SPEP pending. - Renal ultrasound on 3/15 negative for hydronephrosis, UA on 3/14 negative for proteinuria. -Creatinine is still in the high side, 5.2 today, nonoliguric.  Anemia-thrombocytopenia  -On admission found to have new onset of anemia and thrombocytopenia, workup underway, and DIC panel negative, no evidence of microangiopathy therefore unlikely to be TTP, however ADAMTS13 pending. Did get 1 unit of PRBC on 3/14. Does have low vitamin B12 and folate levels, has been started on vitamin B12 and folate supplementation. -Given renal failure, SPEP pending, hematology following some suspicion for myelodysplastic syndrome as well. -Hemoglobin is 6.7, transfuse 2 units of packed RBCs.  Elevated d dimer  -Duplex negative for DVT in legs, patient is hypoxic today,  requires 2-3 L of oxygen to keep sats over 90%. -Chest x-ray checked to rule out parenchymal issues, if negative I will check VQ scan.   Hypoglycemia  - Patient was hypoglycemic on presentation - This is resolved, likely secondary to worsening renal function and the use of Lantus another oral hypoglycemic medications  History of diabetes - Hemoglobin A1c 6.6, given renal failure and hypoglycemic presentation would allow some permissive hypoglycemia. - Started back on Lantus and SSI, CBGs controlled, continue to monitor closely  Hypertension  - Blood pressure currently controlled without the use of any antihypertensive medications. Continue to monitor and resume when able.  Hypothermia  - resolved- no infectious source found, likely secondary to hypoglycemia  Disposition: Remain inpatient- CIR Vs SNF  DVT Prophylaxis: Prophylactic Heparin - watch platelets  Code Status: Full code   Family Communication None at bedside  Procedures:  NonE  CONSULTS:  nephrology and hematology/oncology  Time spent 40 minutes-which includes 50% of the time with face-to-face with patient/ family and coordinating care related to the above assessment and plan.    MEDICATIONS: Scheduled Meds: . cyanocobalamin  1,000 mcg Intramuscular Daily  . folic acid  1 mg Oral Daily  . furosemide  40 mg Intravenous Once  . heparin  5,000 Units Subcutaneous 3 times per day  . insulin aspart  0-15 Units Subcutaneous TID WC  . insulin aspart  0-5 Units Subcutaneous QHS  . insulin glargine  10 Units Subcutaneous Daily  . pantoprazole  40 mg Oral Q1200   Continuous Infusions:   PRN Meds:.  Antibiotics: Anti-infectives   Start     Dose/Rate Route Frequency Ordered Stop   11/22/13 0600  piperacillin-tazobactam (ZOSYN) IVPB 2.25 g  Status:  Discontinued     2.25  g 100 mL/hr over 30 Minutes Intravenous 3 times per day 11/21/13 1904 11/22/13 0905   11/21/13 1615  piperacillin-tazobactam (ZOSYN) IVPB  3.375 g  Status:  Discontinued     3.375 g 12.5 mL/hr over 240 Minutes Intravenous  Once 11/21/13 1603 11/22/13 1511   11/21/13 1615  vancomycin (VANCOCIN) IVPB 1000 mg/200 mL premix     1,000 mg 200 mL/hr over 60 Minutes Intravenous  Once 11/21/13 1603 11/21/13 1738       PHYSICAL EXAM: Vital signs in last 24 hours: Filed Vitals:   11/26/13 1513 11/26/13 2107 11/27/13 0525 11/27/13 1330  BP: 110/58 115/59 105/60 95/59  Pulse: 96 111 104 101  Temp: 98.3 F (36.8 C) 98.9 F (37.2 C) 98.2 F (36.8 C) 98.3 F (36.8 C)  TempSrc: Oral Oral Oral Oral  Resp: _0 Height:      Weight:   67.9 kg (149 lb 11.1 oz)   SpO2: 92% 95% 95% 83%    Weight change: 0.269 kg (9.5 oz) Filed Weights   11/25/13 0534 11/26/13 0638 11/27/13 0525  Weight: 65.59 kg (144 lb 9.6 oz) 67.631 kg (149 lb 1.6 oz) 67.9 kg (149 lb 11.1 oz)   Body mass index is 21.48 kg/(m^2).   Gen Exam: Awake and alert with clear speech.   Neck: Supple, No JVD.   Chest: B/L Clear.   CVS: S1 S2 Regular, no murmurs.  Abdomen: soft, BS +, non tender, non distended.  Extremities: no edema, lower extremities warm to touch. Neurologic: Non Focal.   Skin: No Rash.   Wounds: N/A.   Intake/Output from previous day:  Intake/Output Summary (Last 24 hours) at 11/27/13 1349 Last data filed at 11/27/13 0524  Gross per 24 hour  Intake    340 ml  Output    325 ml  Net     15 ml     LAB RESULTS: CBC  Recent Labs Lab 11/21/13 1350  11/22/13 1840 11/23/13 0353 11/25/13 0500 11/26/13 0500 11/27/13 1200  WBC 5.4  < > 2.9* 3.0* 3.5* 3.5* 3.6*  HGB 8.5*  < > 8.6* 7.6* 7.4* 7.1* 6.5*  HCT 25.5*  < > 24.4* 21.7* 21.5* 20.8* 19.5*  PLT 126*  < > 85* 73* 72* 66* 66*  MCV 110.9*  < > 103.8* 102.4* 103.4* 103.5* 106.0*  MCH 37.0*  < > 36.6* 35.8* 35.6* 35.3* 35.3*  MCHC 33.3  < > 35.2 35.0 34.4 34.1 33.3  RDW 16.4*  < > 19.2* 18.8* 17.4* 17.1* 17.7*  LYMPHSABS 0.4*  --   --   --  0.6*  --   --   MONOABS 0.4  --    --   --  0.3  --   --   EOSABS 0.0  --   --   --  0.1  --   --   BASOSABS 0.0  --   --   --  0.0  --   --   < > = values in this interval not displayed.  Chemistries   Recent Labs Lab 11/24/13 0240 11/25/13 0838 11/25/13 1650 11/26/13 0500 11/26/13 1240 11/27/13 0651 11/27/13 1200  NA 134* 137 136*  --  137  --  136*  K 4.8 4.0 4.0  --  3.8  --  4.0  CL 97 94* 93*  --  96  --  95*  CO2 _1 --  25  --  26  GLUCOSE 252* 118* 208*  --  209*  --  142*  BUN 69* 63* 61*  --  59*  --  60*  CREATININE 5.65* 5.47* 5.50*  --  5.39*  --  5.21*  CALCIUM 7.2* 7.2* 7.2*  --  7.2*  --  7.3*  MG  --   --   --  1.5  --  1.4*  --     CBG:  Recent Labs Lab 11/26/13 1135 11/26/13 1653 11/26/13 2106 11/27/13 0804 11/27/13 1153  GLUCAP 202* 111* 148* 128* 138*    GFR Estimated Creatinine Clearance: 14.3 ml/min (by C-G formula based on Cr of 5.21).  Coagulation profile  Recent Labs Lab 11/21/13 1438 11/22/13 1237  INR 0.95 1.10    Cardiac Enzymes  Recent Labs Lab 11/21/13 1350  TROPONINI <0.30    No components found with this basename: POCBNP,  No results found for this basename: DDIMER,  in the last 72 hours No results found for this basename: HGBA1C,  in the last 72 hours No results found for this basename: CHOL, HDL, LDLCALC, TRIG, CHOLHDL, LDLDIRECT,  in the last 72 hours No results found for this basename: TSH, T4TOTAL, FREET3, T3FREE, THYROIDAB,  in the last 72 hours  Recent Labs  11/26/13 1240  FOLATE 3.9   No results found for this basename: LIPASE, AMYLASE,  in the last 72 hours  Urine Studies No results found for this basename: UACOL, UAPR, USPG, UPH, UTP, UGL, UKET, UBIL, UHGB, UNIT, UROB, ULEU, UEPI, UWBC, URBC, UBAC, CAST, CRYS, UCOM, BILUA,  in the last 72 hours  MICROBIOLOGY: Recent Results (from the past 240 hour(s))  CULTURE, BLOOD (ROUTINE X 2)     Status: None   Collection Time    11/21/13  1:50 PM      Result Value Ref Range Status    Specimen Description BLOOD RIGHT ARM   Final   Special Requests     Final   Value: Immunocompromised BOTTLES DRAWN AEROBIC AND ANAEROBIC 5CC EACH   Culture  Setup Time     Final   Value: 11/21/2013 20:43     Performed at [facility]   Culture     Final   Value: NO GROWTH 5 DAYS     Performed at [facility]   Report Status 11/27/2013 FINAL   Final  CULTURE, BLOOD (ROUTINE X 2)     Status: None   Collection Time    11/21/13  2:15 PM      Result Value Ref Range Status   Specimen Description BLOOD RIGHT RADIAL   Final   Special Requests     Final   Value: Immunocompromised BOTTLES DRAWN AEROBIC AND ANAEROBIC 5CC EACH   Culture  Setup Time     Final   Value: 11/21/2013 20:43     Performed at [facility]   Culture     Final   Value: NO GROWTH 5 DAYS     Performed at [facility]   Report Status 11/27/2013 FINAL   Final  MRSA PCR SCREENING     Status: Abnormal   Collection Time    11/21/13  6:21 PM      Result Value Ref Range Status   MRSA by PCR POSITIVE (*) NEGATIVE Final   Comment:            The GeneXpert MRSA Assay (FDA     approved for NASAL specimens     only), is one component of a     comprehensive  MRSA colonization     surveillance program. It is not     intended to diagnose MRSA     infection nor to guide or     monitor treatment for     MRSA infections.     RESULT CALLED TO, READ BACK BY AND VERIFIED WITH:     Pincus Sanes RN 2225 11/21/13 A BROWNING    RADIOLOGY STUDIES/RESULTS: Ct Head Wo Contrast  11/21/2013   CLINICAL DATA:  Altered mental status  EXAM: CT HEAD WITHOUT CONTRAST  TECHNIQUE: Contiguous axial images were obtained from the base of the skull through the vertex without intravenous contrast. Study was obtained within 24 hr of patient's arrival at the emergency department.  COMPARISON:  None.  FINDINGS: There is moderate diffuse atrophy. There is no mass, hemorrhage, extra-axial fluid collection, or midline shift. There  is mild small vessel disease in the centra semiovale bilaterally. Gray-white compartments elsewhere appear normal. There is no demonstrable acute infarct.  Bony calvarium appears intact. The mastoid air cells are clear. There is opacification of multiple ethmoid sinuses bilaterally. There is mild mucosal thickening in both maxillary antra.  IMPRESSION: Moderate diffuse atrophy with mild periventricular small vessel disease. No intracranial mass, hemorrhage or acute appearing infarct. Multifocal paranasal sinus disease bilaterally.   Electronically Signed   By: Lowella Grip M.D.   On: 11/21/2013 15:49   US Renal Port  11/23/2013   CLINICAL DATA:  Acute renal failure.  EXAM: RENAL/URINARY TRACT ULTRASOUND COMPLETE  COMPARISON:  US RENAL dated 08/26/2012  FINDINGS: Right Kidney:  Length: 12.5 cm. Increased echogenicity. Multiple small simple appearing renal cysts. No hydronephrosis.  Left Kidney:  Length: 12.6 cm. Increased echogenicity. Multiple small simple appearing cysts. No hydronephrosis.  Bladder:  Collapsed around a Foley catheter.  IMPRESSION: 1.  No hydronephrosis. 2. Increased renal echogenicity, suggesting medical renal disease.   Electronically Signed   By: Abigail Miyamoto M.D.   On: 11/23/2013 12:37   Dg Chest Port 1 View  11/22/2013   CLINICAL DATA:  Atelectasis  EXAM: PORTABLE CHEST - 1 VIEW  COMPARISON:  Chest x-ray from yesterday  FINDINGS: The previously reported nodular density in the left mid lung faintly persists. It is ovoid, with the largest dimension measuring 27m. There is no edema or lobar consolidation. No evidence of effusion (as permitted by exclusion of lateral costophrenic sulci sign) or pneumothorax. Normal heart size.  IMPRESSION: Faintly persistent 2 cm nodule in the left chest. Chest CT followup is recommended to exclude a nodule. Preferably, this would be accomplished on an outpatient basis - when there has been time for clearing of any inflammatory or infectious  infiltrates.   Electronically Signed   By: JJorje GuildM.D.   On: 11/22/2013 08:17   Dg Chest Port 1 View  11/21/2013   CLINICAL DATA:  Weakness  EXAM: PORTABLE CHEST - 1 VIEW  COMPARISON:  None.  FINDINGS: Cardiac shadow is within normal limits. The lungs are well aerated bilaterally. There is a questionable nodular density in the left mid lung superimposed over the anterior aspect of the third and fourth ribs. It measures approximately 17 mm. This may be related to overlying artifact. Short-term followup to assess for resolution is recommended.  IMPRESSION: Questionable nodular density in the left mid lung. Short-term followup is recommended. If the lesion persists, nonemergent CT of the chest is recommended for further evaluation.   Electronically Signed   By: MInez CatalinaM.D.   On: 11/21/2013 15:24  Birdie Hopes, MD  [facility] Pager:[phone]  If 7PM-7AM, please contact night-coverage [email or web address] Password [email or web address] 11/27/2013, 1:49 PM   LOS: 6 days

## 2013-11-27 NOTE — Progress Notes (Signed)
Pt with low oxygen saturation of 83% on RA. Dr. Hartford Poli notified, pt placed on [facility] and maintaining O2 sats at 91%, will continue to monitor and follow orders per MD

## 2013-11-27 NOTE — Progress Notes (Signed)
Rehab admissions - Received update from Marshell Levan, Education officer, museum and the plan is now for skilled nursing at discharge. Pt is in agreement with this plan and spoke with pt personally as well.  Will sign off pt's case at this point due to planned DC to skilled nursing. Please call me if any questions arise. Thanks.  Nanetta Batty, PT Rehabilitation Admissions Coordinator [phone]

## 2013-11-27 NOTE — Care Management Note (Signed)
    Page 1 of 1   12/01/2013     12:02:48 PM   CARE MANAGEMENT NOTE 12/01/2013  Patient:  [facility]   Account Number:  [id number]  Date Initiated:  11/27/2013  Documentation initiated by:  Tomi Bamberger  Subjective/Objective Assessment:   dx hypoglycemia  admit- lives alone.     Action/Plan:   Anticipated DC Date:  12/01/2013   Anticipated DC Plan:  SKILLED NURSING FACILITY  In-house referral  Clinical Social Worker      DC Planning Services  CM consult      Choice offered to / List presented to:             Status of service:  Completed, signed off Medicare Important Message given?   (If response is "NO", the following Medicare IM given date fields will be blank) Date Medicare IM given:   Date Additional Medicare IM given:    Discharge Disposition:  [place]  Per UR Regulation:  Reviewed for med. necessity/level of care/duration of stay  If discussed at [place] of Stay Meetings, dates discussed:   11/28/2013    Comments:  11/28/13 1429 Tomi Bamberger RN ,BSN [id number] patient is for snf , CSW following.  11/27/13 11:48 Tomi Bamberger RN, BSN [phone] patient lives alone, per phsical therapy recs CIR , CSW working on SNF as well, CIR will not have a bed available, patient lives alone and will be unsafe for patient to go home alone, awaiting auth for snf.

## 2013-11-27 NOTE — Clinical Social Work Placement (Signed)
Clinical Social Work Department CLINICAL SOCIAL WORK PLACEMENT NOTE 11/27/2013  Patient:  [facility]  Account Number:  [id number] Admit date:  11/21/2013  Clinical Social Worker:  Lovey Newcomer  Date/time:  11/26/2013 08:47 PM  Clinical Social Work is seeking post-discharge placement for this patient at the following level of care:   [place]   (*CSW will update this form in Epic as items are completed)   11/26/2013  Patient/family provided with [place] Department of Clinical Social Work's list of facilities offering this level of care within the geographic area requested by the patient (or if unable, by the patient's family).  11/26/2013  Patient/family informed of their freedom to choose among providers that offer the needed level of care, that participate in Medicare, Medicaid or managed care program needed by the patient, have an available bed and are willing to accept the patient.  11/26/2013  Patient/family informed of [facility]' ownership interest in [facility], as well as of the fact that they are under no obligation to receive care at this facility.  PASARR submitted to EDS on 11/26/2013 PASARR number received from EDS on 11/26/2013  FL2 transmitted to all facilities in geographic area requested by pt/family on  11/26/2013 FL2 transmitted to all facilities within larger geographic area on   Patient informed that his/her managed care company has contracts with or will negotiate with  certain facilities, including the following:     Patient/family informed of bed offers received:  11/27/2013 Patient chooses bed at [facility] [place] [facility] Physician recommends and patient chooses bed at    Patient to be transferred to [place] on   Patient to be transferred to facility by   The following physician request were entered in Epic:   Additional Comments: Patient chose [place]. Facility has  started [employer] [facility].   Liz [place], [place], [place], [phone]

## 2013-11-28 ENCOUNTER — Inpatient Hospital Stay (HOSPITAL_COMMUNITY): Payer: BC Managed Care – PPO

## 2013-11-28 LAB — CBC
HCT: 27.5 % — ABNORMAL LOW (ref 39.0–52.0)
HEMOGLOBIN: 9.4 g/dL — AB (ref 13.0–17.0)
MCH: 33.8 pg (ref 26.0–34.0)
MCHC: 34.2 g/dL (ref 30.0–36.0)
MCV: 98.9 fL (ref 78.0–100.0)
PLATELETS: 78 10*3/uL — AB (ref 150–400)
RBC: 2.78 MIL/uL — AB (ref 4.22–5.81)
RDW: 20.4 % — ABNORMAL HIGH (ref 11.5–15.5)
WBC: 5.7 10*3/uL (ref 4.0–10.5)

## 2013-11-28 LAB — RENAL FUNCTION PANEL
Albumin: 2.5 g/dL — ABNORMAL LOW (ref 3.5–5.2)
BUN: 64 mg/dL — ABNORMAL HIGH (ref 6–23)
CHLORIDE: 97 meq/L (ref 96–112)
CO2: 24 meq/L (ref 19–32)
Calcium: 8 mg/dL — ABNORMAL LOW (ref 8.4–10.5)
Creatinine, Ser: 5.41 mg/dL — ABNORMAL HIGH (ref 0.50–1.35)
GFR, EST AFRICAN AMERICAN: 12 mL/min — AB (ref >90)
GFR, EST NON AFRICAN AMERICAN: 10 mL/min — AB (ref >90)
Glucose, Bld: 181 mg/dL — ABNORMAL HIGH (ref 70–99)
POTASSIUM: 4.6 meq/L (ref 3.7–5.3)
Phosphorus: 4.1 mg/dL (ref 2.3–4.6)
SODIUM: 138 meq/L (ref 137–147)

## 2013-11-28 LAB — ADAMTS13 ACTIVITY: Adamts 13 Activity: 55 % Activity — ABNORMAL LOW (ref 68–163)

## 2013-11-28 LAB — GLUCOSE, CAPILLARY
Glucose-Capillary: 163 mg/dL — ABNORMAL HIGH (ref 70–99)
Glucose-Capillary: 143 mg/dL — ABNORMAL HIGH (ref 70–99)
Glucose-Capillary: 106 mg/dL — ABNORMAL HIGH (ref 70–99)
Glucose-Capillary: 140 mg/dL — ABNORMAL HIGH (ref 70–99)
Glucose-Capillary: 205 mg/dL — ABNORMAL HIGH (ref 70–99)

## 2013-11-28 LAB — MAGNESIUM: MAGNESIUM: 1.9 mg/dL (ref 1.5–2.5)

## 2013-11-28 MED ORDER — TECHNETIUM TC 99M DIETHYLENETRIAME-PENTAACETIC ACID: 40.0000 | Freq: Once | INTRAVENOUS | Status: AC | PRN

## 2013-11-28 MED ORDER — CYANOCOBALAMIN 1000 MCG/ML IJ SOLN: 1000.0000 ug | Freq: Every day | INTRAMUSCULAR | Status: AC

## 2013-11-28 MED ORDER — PANTOPRAZOLE SODIUM 40 MG PO TBEC: 40.0000 mg | DELAYED_RELEASE_TABLET | Freq: Every day | ORAL | Status: AC

## 2013-11-28 MED ORDER — TECHNETIUM TO 99M ALBUMIN AGGREGATED
6.0000 | Freq: Once | INTRAVENOUS | Status: AC | PRN
  Administered 2013-11-28: 6 via INTRAVENOUS

## 2013-11-28 MED ORDER — FOLIC ACID 1 MG PO TABS: 1.0000 mg | ORAL_TABLET | Freq: Every day | ORAL | Status: AC

## 2013-11-28 MED ORDER — METOPROLOL SUCCINATE 12.5 MG HALF TABLET: 50.0000 mg | ORAL_TABLET | Freq: Every day | ORAL | Status: AC

## 2013-11-28 NOTE — Progress Notes (Signed)
Pt received 2units PRBC, pt tolerated well. VSS. Will continue to monitor.

## 2013-11-28 NOTE — Progress Notes (Signed)
Physical Therapy Treatment Patient Details Name: Micheal Goodman MRN: [id number] DOB: 07/03/1951 Today's Date: 11/28/2013 Time: 4332-9518 PT Time Calculation (min): 24 min  PT Assessment / Plan / Recommendation  History of Present Illness Micheal Goodman is a 63 y.o. male presenting on 11/21/2013 with has a past medical history of Hypertension; Diabetes mellitus without complication; Arthritis; Anemia; and Basal cell carcinoma whowas transferred from [facility] after presenting there with syncope and hypoglycemia. Pt denies any history of renal insufficiency though Scr 5/14 was 1.5. On presentation Scr was 7.4. He was briefly started on IV fluids, was also found to have anemia and thrombocytopenia, nephrology and hematology has been consulted. We'll function is slowly improving, work up for anemia is in progress   PT Comments   Patient making progress with mobility and gait.  Agree with need for SNF at discharge for continued therapy due to decreased strength, balance, safety, and activity tolerance.    Follow Up Recommendations  SNF     Does the patient have the potential to tolerate intense rehabilitation     Barriers to Discharge        Equipment Recommendations  Rolling walker with 5" wheels    Recommendations for Other Services    Frequency Min 3X/week   Progress towards PT Goals Progress towards PT goals: Progressing toward goals  Plan Discharge plan needs to be updated    Precautions / Restrictions Precautions Precautions: Fall Restrictions Weight Bearing Restrictions: No   Pertinent Vitals/Pain     Mobility  Bed Mobility Overal bed mobility: Modified Independent General bed mobility comments: no rail and HOB flat Transfers Overall transfer level: Needs assistance Equipment used: Rolling walker (2 wheeled) Transfers: Sit to/from Stand Sit to Stand: Min assist General transfer comment: pt unsteady, cues for safety/correct hand placement.  Practiced sit >  stand x2 at end of session. Ambulation/Gait Ambulation/Gait assistance: Min assist Ambulation Distance (Feet): 160 Feet Assistive device: Rolling walker (2 wheeled) Gait Pattern/deviations: Step-through pattern;Decreased stride length;Trunk flexed;Ataxic Gait velocity: decreased Gait velocity interpretation: Below normal speed for age/gender General Gait Details: Verbal cues to remain close to RW and keep feet inside RW.  Assist with maneuvering RW in turns.  Decreased balance with gait/turns.    Exercises     PT Diagnosis:    PT Problem List:   PT Treatment Interventions:     PT Goals (current goals can now be found in the care plan section)    Visit Information  Last PT Received On: 11/28/13 Assistance Needed: +1 History of Present Illness: Micheal Goodman is a 63 y.o. male presenting on 11/21/2013 with has a past medical history of Hypertension; Diabetes mellitus without complication; Arthritis; Anemia; and Basal cell carcinoma whowas transferred from [facility] after presenting there with syncope and hypoglycemia. Pt denies any history of renal insufficiency though Scr 5/14 was 1.5. On presentation Scr was 7.4. He was briefly started on IV fluids, was also found to have anemia and thrombocytopenia, nephrology and hematology has been consulted. We'll function is slowly improving, work up for anemia is in progress    Subjective Data  Subjective: How long do you think I'll be in Rehab before going home?   Cognition  Cognition Arousal/Alertness: Awake/alert Behavior During Therapy: WFL for tasks assessed/performed Overall Cognitive Status: Within Functional Limits for tasks assessed    Balance  Balance Overall balance assessment: Needs assistance Standing balance support: Single extremity supported Standing balance-Leahy Scale: Fair General Comments General comments (skin integrity, edema, etc.):  Noted horizontal nystagmus at rest.  Patient denies dizziness.  End of  Session PT - End of Session Equipment Utilized During Treatment: Gait belt Activity Tolerance: Patient tolerated treatment well;Patient limited by fatigue Patient left: in chair;with call bell/phone within reach;with family/visitor present Nurse Communication: Mobility status   GP     Despina Pole 11/28/2013, 5:32 PM Carita Pian. Sanjuana Kava, [place] Pager [phone]

## 2013-11-28 NOTE — Discharge Summary (Signed)
Physician Discharge Summary  Josmar Messimer OZD:[id number] DOB: 04/09/1951 DOA: 11/21/2013  PCP: Woody Seller, MD  Admit date: 11/21/2013 Discharge date: 11/28/2013  Time spent: 45 minutes  Recommendations for Outpatient Follow-up:  1. Please follow up with PCP in one week to discuss progress and review laboratory results 2. Followup with Dr. Posey Pronto of nephrology as outpatient. 3. Please check oxygen sat daily, if patient needs more oxygen it might be an indicator that he is being fluid overloaded. 4. Check CBC and BMP within 5 days.   Discharge Diagnoses:  Principal Problem:   Acute renal failure Active Problems:   Anemia   Hypoglycemia   Hypothermia   DM (diabetes mellitus)   Thrombocytopenia, unspecified    History of present illness:  This morning patient states he feels well and stronger than he has felt since admission. He has no complaints and denies any chills, headache, dizziness, chest pain, SOB, abdominal pain, nausea or vomiting.  Hospital Course:  Keygan Dumond is a 63 y.o. male who presented on 11/21/2013 with has a past medical history of Hypertension; Diabetes mellitus without complication; Arthritis; Anemia; and Basal cell carcinoma whowas transferred from [facility] after presenting there with syncope and hypoglycemia. Pt denies any history of renal insufficiency though Scr 5/14 was 1.5. On presentation Scr was 7.4. He was briefly started on IV fluids, was also found to have anemia and thrombocytopenia, nephrology and hematology have been consulted. Renal function is slowly improving, work up for anemia is in progress.  Acute Renal Failure -Patient presented to the hospital with creatinine of 7.4, baseline creatinine from May of 2014 was 1.4. -Patient was admitted, briefly required IV bicarbonate infusion -Suspicion for ATN secondary to hypotension and ARB use.  -Given anemia and thrombocytopenia, multiple myeloma in the differential - SPEP was done  did not show monoclonal spike -Renal ultrasound on 3/15 negative for hydronephrosis, UA on 3/14 negative for proteinuria. -Creatinine seems to plateau around 5.4, patient to followup with nephrology as outpatient, no indication for dialysis. -Follow closely if patient developed fluid overload or less urine output to return to the hospital.  Anemia/Thrombocytopenia -On admission found to have new onset of anemia and thrombocytopenia -DIC panel negative, no evidence of microangiopathy therefore. -ADAMTS 13 level not consistent with TTP -Recieved 1 unit of PRBC on 3/14; Low vitamin B12 and folate levels- continue supplementation.  -Recieved 2 units of PRBC on 3/19 for Hgb of 6.7; Hgb up to 9.4 on 3/20  Elevated d dimer  -Duplex negative for DVT in legs, patient is hypoxic today, requires 4 L of oxygen to keep sats over 90%.  -Chest x-ray on 3/19 showed bilateral pleural effusions likely due to fluid overload -STAT VQ scan om 3/20 showed low probability for PE -Patient stable without any complaints of dizziness, lightheadedness, chest pain, SOB, or difficulty breathing. -On discharge patient required about 3 L of oxygen to keep saturation about 93%.  Hypoglycemia -Patient found to be hypoglycemic on presentation with a CBG of 32 -This is resolved likely secondary to worsening renal function and use of lantus and hypoglycemic medications. -On discharge I restarted Lantus and Actos, discontinued metformin.    Hypothermia -Resolved -Blood cultures negative- likely secondary to hypoglycemia  DM (diabetes mellitus) -Hemoglobin A1C 6.6 -CBGs stable- resume home medication regimen   Hypertension -Blood pressure currently controlled without use of antihypertensives -On discharge resumed Toprol-XL on lower dose, continue to hold amlodipine and Cozaar   Discharge Condition: Stable  Diet recommendation: Carb modified  Filed Weights   11/25/13 0534 11/26/13 8768 11/27/13 0525  Weight:  65.59 kg (144 lb 9.6 oz) 67.631 kg (149 lb 1.6 oz) 67.9 kg (149 lb 11.1 oz)     Procedures:  Venous Doppler- No evidence of DVT  Consultations:  Dr. Reather Laurence of Nephrology  Dr. Alen Blew of Oncology  Dr. Risa Grill of Urology   Discharge Exam: Filed Vitals:   11/28/13 0529  BP: 125/78  Pulse: 103  Temp: 97.7 F (36.5 C)  Resp: 20    Gen Exam: Awake and alert with clear speech, in mild distress.  Neck: Supple, No JVD, no masses, no lymphadenopathy Chest: B/L Clear with equal chest rise.  CVS: S1 S2 Regular, no murmurs.  Abdomen: Soft, BS +, non tender, non distended.  Extremities: no edema, lower extremities warm to touch.  Neurologic: Non Focal.  Skin: No Rash or lesions Wounds: N/A.    Discharge Instructions     Medication List    ASK your doctor about these medications       amLODipine 5 MG tablet  Commonly known as:  NORVASC  Take 5 mg by mouth daily.     aspirin 81 MG tablet  Take 81 mg by mouth daily.     atorvastatin 20 MG tablet  Commonly known as:  LIPITOR  Take 20 mg by mouth daily.     insulin glargine 100 UNIT/ML injection  Commonly known as:  LANTUS  Inject 10 Units into the skin daily.     losartan 50 MG tablet  Commonly known as:  COZAAR  Take 50 mg by mouth daily.     metFORMIN 1000 MG tablet  Commonly known as:  GLUCOPHAGE  Take 1,000 mg by mouth 2 (two) times daily with a meal.     metoprolol succinate 50 MG 24 hr tablet  Commonly known as:  TOPROL-XL  Take 50 mg by mouth daily. Take with or immediately following a meal.     multivitamins with iron Tabs tablet  Take 1 tablet by mouth daily.     pioglitazone 45 MG tablet  Commonly known as:  ACTOS  Take 45 mg by mouth daily.       Allergies  Allergen Reactions  . Altace [Ramipril]     weakness      The results of significant diagnostics from this hospitalization (including imaging, microbiology, ancillary and laboratory) are listed below for reference.    Significant  Diagnostic Studies: Dg Chest 2 View  11/27/2013   CLINICAL DATA:  Shortness of breath.  Chest pain.  Weakness.  EXAM: CHEST  2 VIEW  COMPARISON:  11/22/2013  FINDINGS: There are new small bilateral pleural effusions. Heart size is normal. Pulmonary vascularity has increased. There is slight atelectasis at the bases posteriorly. No acute osseous abnormality.  IMPRESSION: New pulmonary vascular congestion and small bilateral pleural effusions and bibasilar atelectasis. Findings probably represent fluid overload.   Electronically Signed   By: Rozetta Nunnery M.D.   On: 11/27/2013 15:50   Ct Head Wo Contrast  11/21/2013   CLINICAL DATA:  Altered mental status  EXAM: CT HEAD WITHOUT CONTRAST  TECHNIQUE: Contiguous axial images were obtained from the base of the skull through the vertex without intravenous contrast. Study was obtained within 24 hr of patient's arrival at the emergency department.  COMPARISON:  None.  FINDINGS: There is moderate diffuse atrophy. There is no mass, hemorrhage, extra-axial fluid collection, or midline shift. There is mild small vessel disease in the centra semiovale bilaterally.  Gray-white compartments elsewhere appear normal. There is no demonstrable acute infarct.  Bony calvarium appears intact. The mastoid air cells are clear. There is opacification of multiple ethmoid sinuses bilaterally. There is mild mucosal thickening in both maxillary antra.  IMPRESSION: Moderate diffuse atrophy with mild periventricular small vessel disease. No intracranial mass, hemorrhage or acute appearing infarct. Multifocal paranasal sinus disease bilaterally.   Electronically Signed   By: Lowella Grip M.D.   On: 11/21/2013 15:49   US Renal Port  11/23/2013   CLINICAL DATA:  Acute renal failure.  EXAM: RENAL/URINARY TRACT ULTRASOUND COMPLETE  COMPARISON:  US RENAL dated 08/26/2012  FINDINGS: Right Kidney:  Length: 12.5 cm. Increased echogenicity. Multiple small simple appearing renal cysts. No  hydronephrosis.  Left Kidney:  Length: 12.6 cm. Increased echogenicity. Multiple small simple appearing cysts. No hydronephrosis.  Bladder:  Collapsed around a Foley catheter.  IMPRESSION: 1.  No hydronephrosis. 2. Increased renal echogenicity, suggesting medical renal disease.   Electronically Signed   By: Abigail Miyamoto M.D.   On: 11/23/2013 12:37   Dg Chest Port 1 View  11/22/2013   CLINICAL DATA:  Atelectasis  EXAM: PORTABLE CHEST - 1 VIEW  COMPARISON:  Chest x-ray from yesterday  FINDINGS: The previously reported nodular density in the left mid lung faintly persists. It is ovoid, with the largest dimension measuring 43m. There is no edema or lobar consolidation. No evidence of effusion (as permitted by exclusion of lateral costophrenic sulci sign) or pneumothorax. Normal heart size.  IMPRESSION: Faintly persistent 2 cm nodule in the left chest. Chest CT followup is recommended to exclude a nodule. Preferably, this would be accomplished on an outpatient basis - when there has been time for clearing of any inflammatory or infectious infiltrates.   Electronically Signed   By: JJorje GuildM.D.   On: 11/22/2013 08:17   Dg Chest Port 1 View  11/21/2013   CLINICAL DATA:  Weakness  EXAM: PORTABLE CHEST - 1 VIEW  COMPARISON:  None.  FINDINGS: Cardiac shadow is within normal limits. The lungs are well aerated bilaterally. There is a questionable nodular density in the left mid lung superimposed over the anterior aspect of the third and fourth ribs. It measures approximately 17 mm. This may be related to overlying artifact. Short-term followup to assess for resolution is recommended.  IMPRESSION: Questionable nodular density in the left mid lung. Short-term followup is recommended. If the lesion persists, nonemergent CT of the chest is recommended for further evaluation.   Electronically Signed   By: MInez CatalinaM.D.   On: 11/21/2013 15:24    Microbiology: Recent Results (from the past 240 hour(s))   CULTURE, BLOOD (ROUTINE X 2)     Status: None   Collection Time    11/21/13  1:50 PM      Result Value Ref Range Status   Specimen Description BLOOD RIGHT ARM   Final   Special Requests     Final   Value: Immunocompromised BOTTLES DRAWN AEROBIC AND ANAEROBIC 5CC EACH   Culture  Setup Time     Final   Value: 11/21/2013 20:43     Performed at [facility]  Culture     Final   Value: NO GROWTH 5 DAYS     Performed at [facility]  Report Status 11/27/2013 FINAL   Final  CULTURE, BLOOD (ROUTINE X 2)     Status: None   Collection Time    11/21/13  2:15 PM  Result Value Ref Range Status   Specimen Description BLOOD RIGHT RADIAL   Final   Special Requests     Final   Value: Immunocompromised BOTTLES DRAWN AEROBIC AND ANAEROBIC 5CC EACH   Culture  Setup Time     Final   Value: 11/21/2013 20:43     Performed at [facility]   Culture     Final   Value: NO GROWTH 5 DAYS     Performed at [facility]   Report Status 11/27/2013 FINAL   Final  MRSA PCR SCREENING     Status: Abnormal   Collection Time    11/21/13  6:21 PM      Result Value Ref Range Status   MRSA by PCR POSITIVE (*) NEGATIVE Final   Comment:            The GeneXpert MRSA Assay (FDA     approved for NASAL specimens     only), is one component of a     comprehensive MRSA colonization     surveillance program. It is not     intended to diagnose MRSA     infection nor to guide or     monitor treatment for     MRSA infections.     RESULT CALLED TO, READ BACK BY AND VERIFIED WITH:     T TOMLINSON RN 2225 11/21/13 A BROWNING     Labs: Basic Metabolic Panel:  Recent Labs Lab 11/24/13 0240 11/25/13 0838 11/25/13 1650 11/26/13 0500 11/26/13 1240 11/27/13 0651 11/27/13 1200 11/28/13 0700  NA 134* 137 136*  --  137  --  136* 138  K 4.8 4.0 4.0  --  3.8  --  4.0 4.6  CL 97 94* 93*  --  96  --  95* 97  CO2 _0 --  25  --  26 24  GLUCOSE 252* 118* 208*  --  209*  --   142* 181*  BUN 69* 63* 61*  --  59*  --  60* 64*  CREATININE 5.65* 5.47* 5.50*  --  5.39*  --  5.21* 5.41*  CALCIUM 7.2* 7.2* 7.2*  --  7.2*  --  7.3* 8.0*  MG  --   --   --  1.5  --  1.4*  --  1.9  PHOS 3.8 3.9 3.7  --  3.4  --   --  4.1   Liver Function Tests:  Recent Labs Lab 11/21/13 1350  11/24/13 0240 11/25/13 0838 11/25/13 1650 11/26/13 1240 11/28/13 0700  AST 9  --   --   --   --   --   --   ALT <5  --   --   --   --   --   --   ALKPHOS 45  --   --   --   --   --   --   BILITOT 0.2*  --   --   --   --   --   --   PROT 6.2  --   --   --   --   --   --   ALBUMIN 3.2*  < > 2.4* 2.4* 2.5* 2.3* 2.5*  < > = values in this interval not displayed. No results found for this basename: LIPASE, AMYLASE,  in the last 168 hours No results found for this basename: AMMONIA,  in the last 168 hours CBC:  Recent Labs Lab 11/21/13 1350  11/23/13 0353 11/25/13 0500 11/26/13 0500 11/27/13 1200 11/28/13 0700  WBC 5.4  < > 3.0* 3.5* 3.5* 3.6* 5.7  NEUTROABS 4.6  --   --  2.5  --   --   --   HGB 8.5*  < > 7.6* 7.4* 7.1* 6.5* 9.4*  HCT 25.5*  < > 21.7* 21.5* 20.8* 19.5* 27.5*  MCV 110.9*  < > 102.4* 103.4* 103.5* 106.0* 98.9  PLT 126*  < > 73* 72* 66* 66* 78*  < > = values in this interval not displayed. Cardiac Enzymes:  Recent Labs Lab 11/21/13 1350  CKTOTAL 37  TROPONINI <0.30   BNP: BNP (last 3 results) No results found for this basename: PROBNP,  in the last 8760 hours CBG:  Recent Labs Lab 11/27/13 0804 11/27/13 1153 11/27/13 1719 11/27/13 2121 11/28/13 0805  GLUCAP 128* 138* 109* 175* 163*       Signed:  Ruben Im  [facility] 11/28/2013, 10:12 AM   Addendum  Patient seen and examined, chart and data base reviewed.  I agree with the above assessment and plan.  For full details please see Mrs. Ruben Im PA-S note.  I reviewed and addended the above-noted as needed   Birdie Hopes, MD [facility] Pager:  [phone] 11/28/2013, 3:41 PM

## 2013-11-28 NOTE — Progress Notes (Signed)
Patient ID: Micheal Goodman, male   DOB: [birth year], 63 y.o.   MRN: [id number]   [facility] Progress Note    Assessment/ Plan:   1. ARF-non-oliguric on CKD 3: Likely ATN from hypotension/ARB. Renal function slightly declined overnight and appears to be a consequence of transient hypotension/tachycardia with renal hypoperfusion. Intravenous fluids were discontinued yesterday after he developed some shortness of breath requiring single dose of IV Lasix. Remains without any acute dialysis needs/acute electrolyte or volume concerns. No change to treatment planned at this time.   2. Hypertension,Initially hypotensive on admission BP improved off anti-HTN Rx and s/p IVFs. BPs appear acceptable off treatment  3. Pancytopenia: seen by hematology and folate/B12 started- plasma cell dyscrasia screening negative and plans noted for bone marrow biopsy to evaluate for MDS if cell counts don't improve. No evidence of MAHA/hemolysis. Status post 2 units of packed red cell transfusion yesterday 4. Deconditioning: upon evaluation by CIR/PT---recommendations made for SNF admission    Subjective:   Reports to be feeling a little stronger after transfusion yesterday. Denies any shortness of breath or chest pain.    Objective:   BP 125/78  Pulse 103  Temp(Src) 97.7 F (36.5 C) (Oral)  Resp 20  Ht _0  (1.778 m)  Wt 67.5 kg (148 lb 13 oz)  BMI 21.35 kg/m2  SpO2 92%  Intake/Output Summary (Last 24 hours) at 11/28/13 1007 Last data filed at 11/28/13 0948  Gross per 24 hour  Intake    247 ml  Output   1000 ml  Net   -753 ml   Weight change: -0.4 kg (-14.1 oz)  Physical Exam: Gen: Comfortably resting in bed CVS: Pulse regular tachycardia, S1 and S2 with an ejection systolic murmur Resp: Coarse breath sounds bilaterally-no rales/rhonchi Abd: Soft, flat, nontender Ext: Trace lower extremity edema  Imaging: Dg Chest 2 View  11/27/2013   CLINICAL DATA:  Shortness of breath.  Chest  pain.  Weakness.  EXAM: CHEST  2 VIEW  COMPARISON:  11/22/2013  FINDINGS: There are new small bilateral pleural effusions. Heart size is normal. Pulmonary vascularity has increased. There is slight atelectasis at the bases posteriorly. No acute osseous abnormality.  IMPRESSION: New pulmonary vascular congestion and small bilateral pleural effusions and bibasilar atelectasis. Findings probably represent fluid overload.   Electronically Signed   By: Rozetta Nunnery M.D.   On: 11/27/2013 15:50    Labs: BMET  Recent Labs Lab 11/22/13 1840 11/23/13 0935 11/24/13 0240 11/25/13 0838 11/25/13 1650 11/26/13 1240 11/27/13 1200 11/28/13 0700  NA 132* 134* 134* 137 136* 137 136* 138  K 4.8 4.6 4.8 4.0 4.0 3.8 4.0 4.6  CL 101 100 97 94* 93* 96 95* 97  CO2 14* 17* _1 GLUCOSE 338* 197* 252* 118* 208* 209* 142* 181*  BUN 73* 70* 69* 63* 61* 59* 60* 64*  CREATININE 6.18* 5.94* 5.65* 5.47* 5.50* 5.39* 5.21* 5.41*  CALCIUM 7.5* 7.4* 7.2* 7.2* 7.2* 7.2* 7.3* 8.0*  PHOS  --  3.9 3.8 3.9 3.7 3.4  --  4.1   CBC  Recent Labs Lab 11/21/13 1350  11/25/13 0500 11/26/13 0500 11/27/13 1200 11/28/13 0700  WBC 5.4  < > 3.5* 3.5* 3.6* 5.7  NEUTROABS 4.6  --  2.5  --   --   --   HGB 8.5*  < > 7.4* 7.1* 6.5* 9.4*  HCT 25.5*  < > 21.5* 20.8* 19.5* 27.5*  MCV 110.9*  < > 103.4* 103.5*  106.0* 98.9  PLT 126*  < > 72* 66* 66* 78*  < > = values in this interval not displayed.  Medications:    . cyanocobalamin  1,000 mcg Intramuscular Daily  . folic acid  1 mg Oral Daily  . heparin  5,000 Units Subcutaneous 3 times per day  . insulin aspart  0-15 Units Subcutaneous TID WC  . insulin aspart  0-5 Units Subcutaneous QHS  . insulin glargine  10 Units Subcutaneous Daily  . pantoprazole  40 mg Oral Q1200   Elmarie Shiley, MD 11/28/2013, 10:07 AM

## 2013-11-28 NOTE — Progress Notes (Signed)
Events and noted.  His labs reviewed today from the last few days and his blood counts continue to be stable around 60-70,000. His hemoglobin did drop to about 6.6. I agree with the transfusion at this point. We'll likely continue folic acid for the time being as well as the vitamin B12 injections as an outpatient upon his discharge. We'll arrange followup for him upon his discharge which appears to be soon.  Please call with questions.

## 2013-11-28 NOTE — Clinical Social Work Note (Signed)
3:30 PM: CSW received call from [facility] representative stating that patient has been approved for SNF and gave the reference number: [id number]. CSW explained to representative that she needs to relay this information to the receiving facility ([facility]). CSW provided number to representative.   3:45PM: CSW contacted [facility] to determine if patient could come to facility. Facility stated that they are negotiating with [facility] for payment. CSW faxed over patient's discharge summary at this time.  4:15PM: CSW followed up with facility to see if negotiation was completed. Facility states that they are still negotiating with [employer] and will call CSW when they are finished.  5:30PM: CSW never received call from facility about patient coming to facility. CSW contacted facility and asked to speak with someone in admissions. Social Worker with facility stated that [facility] is now closed and that the facility was not able to negotiate payment with [facility].  5:35PM: CSW met with patient at bedside to explain what transpired within the last two hours.   CSW will continue to follow for DC needs.  Liz Beach, [place], [place], [phone]

## 2013-11-28 NOTE — Progress Notes (Signed)
Was going to remove O2 and watch Satuation, but pt O2 on 4L was at 90%

## 2013-11-28 NOTE — Progress Notes (Signed)
Pt Brother called and informed that Pt is going to be admitted to [facility] in [place] upon discharge.

## 2013-11-29 LAB — GLUCOSE, CAPILLARY
GLUCOSE-CAPILLARY: 193 mg/dL — AB (ref 70–99)
Glucose-Capillary: 105 mg/dL — ABNORMAL HIGH (ref 70–99)
Glucose-Capillary: 219 mg/dL — ABNORMAL HIGH (ref 70–99)
Glucose-Capillary: 209 mg/dL — ABNORMAL HIGH (ref 70–99)

## 2013-11-29 LAB — BLOOD GAS, ARTERIAL
ACID-BASE EXCESS: 2.5 mmol/L — AB (ref 0.0–2.0)
Bicarbonate: 25.7 mEq/L — ABNORMAL HIGH (ref 20.0–24.0)
DRAWN BY: 283381
FIO2: 0.21 %
O2 Saturation: 96.4 %
PATIENT TEMPERATURE: 98.6
PO2 ART: 67.5 mmHg — AB (ref 80.0–100.0)
TCO2: 26.7 mmol/L (ref 0–100)
pCO2 arterial: 33.7 mmHg — ABNORMAL LOW (ref 35.0–45.0)
pH, Arterial: 7.494 — ABNORMAL HIGH (ref 7.350–7.450)

## 2013-11-29 LAB — TYPE AND SCREEN
ABO/RH(D): A POS
ANTIBODY SCREEN: NEGATIVE
UNIT DIVISION: 0
Unit division: 0

## 2013-11-29 LAB — RENAL FUNCTION PANEL
ALBUMIN: 2.2 g/dL — AB (ref 3.5–5.2)
BUN: 64 mg/dL — AB (ref 6–23)
CO2: 24 mEq/L (ref 19–32)
Calcium: 8 mg/dL — ABNORMAL LOW (ref 8.4–10.5)
Chloride: 94 mEq/L — ABNORMAL LOW (ref 96–112)
Creatinine, Ser: 5.25 mg/dL — ABNORMAL HIGH (ref 0.50–1.35)
GFR, EST AFRICAN AMERICAN: 12 mL/min — AB (ref >90)
GFR, EST NON AFRICAN AMERICAN: 11 mL/min — AB (ref >90)
Glucose, Bld: 221 mg/dL — ABNORMAL HIGH (ref 70–99)
POTASSIUM: 3.8 meq/L (ref 3.7–5.3)
Phosphorus: 3.9 mg/dL (ref 2.3–4.6)
SODIUM: 134 meq/L — AB (ref 137–147)

## 2013-11-29 NOTE — Progress Notes (Signed)
Patient ID: Micheal Goodman, male   DOB: 19-Feb-1951, 63 y.o.   MRN: [id number]  [facility] Progress Note    Assessment/ Plan:   1. ARF-non-oliguric on CKD 3: Likely ATN from hypotension/ARB. Renal function panel to be checked today to track his most recent creatinine/electrolytes. Volume-wise, he appears to be close to euvolemic and without any respiratory distress to require intermittent Lasix boluses.  2. Hypertension,Initially hypotensive on admission BP improved off anti-HTN Rx and s/p IVFs. BPs appear acceptable off treatment  3. Pancytopenia: seen by hematology and folate/B12 started- plasma cell dyscrasia screening negative and plans noted for bone marrow biopsy to evaluate for MDS if cell counts don't improve. No evidence of MAHA/hemolysis. Status post 2 units of packed red cell transfusion yesterday  4. Deconditioning: awaiting SNF admission  5. Hypoxemia: Patient noted to have prominent desaturation of oxygen therapy. Chest x-ray that shows mild vascular congestion and had a VQ scan done that was low probability for PE. No therapeutic changes at this time.  Subjective:   Apparently supposed to be discharged yesterday however, not accepted by skilled nursing facility due to their contractual problems with the patient's insurance.    Objective:   BP 117/71  Pulse 89  Temp(Src) 98.2 F (36.8 C) (Oral)  Resp 20  Ht _0  (1.778 m)  Wt 67.6 kg (149 lb 0.5 oz)  BMI 21.38 kg/m2  SpO2 92%  Intake/Output Summary (Last 24 hours) at 11/29/13 1022 Last data filed at 11/29/13 0900  Gross per 24 hour  Intake    422 ml  Output   1450 ml  Net  -1028 ml   Weight change: 0.1 kg (3.5 oz)  Physical Exam: Gen: Comfortably resting up in his recliner, watching television CVS: Pulse regular in rate and rhythm, S1 and S2 normal Resp: Decreased breath sounds of both bases-no distinct rales or rhonchi Abd: Soft, obese, nontender and bowel sounds are normal Ext: No lower  extremity edema  Imaging: Dg Chest 2 View  11/27/2013   CLINICAL DATA:  Shortness of breath.  Chest pain.  Weakness.  EXAM: CHEST  2 VIEW  COMPARISON:  11/22/2013  FINDINGS: There are new small bilateral pleural effusions. Heart size is normal. Pulmonary vascularity has increased. There is slight atelectasis at the bases posteriorly. No acute osseous abnormality.  IMPRESSION: New pulmonary vascular congestion and small bilateral pleural effusions and bibasilar atelectasis. Findings probably represent fluid overload.   Electronically Signed   By: Rozetta Nunnery M.D.   On: 11/27/2013 15:50   Nm Pulmonary Perf And Vent  11/28/2013   CLINICAL DATA:  Shortness of breath, evaluate for pulmonary embolism  EXAM: NUCLEAR MEDICINE VENTILATION - PERFUSION LUNG SCAN  TECHNIQUE: Ventilation images were obtained in multiple projections using inhaled aerosol technetium 99 M DTPA. Perfusion images were obtained in multiple projections after intravenous injection of Tc-41mMAA.  RADIOPHARMACEUTICALS:  40 mCi Tc-934mTPA aerosol and 6 mCi Tc-9965mA  COMPARISON:  DG CHEST 2 VIEW dated 11/27/2013; DG CHEST 1V PORT dated 11/21/2013  FINDINGS: Review of chest radiograph performed earlier same day demonstrates grossly unchanged cardiac silhouette and mediastinal contours. Small bilateral pleural effusions, left greater than right. Mild pulmonary edema superimposed on advanced at the sinus change. Minimal basilar opacities, left greater than right, likely atelectasis.  Ventilation: There is suboptimal ventilation of the bilateral upper lobes as well as the bilateral lower lungs, left greater than right, secondary to unknown bilateral pleural effusions. There is minimal clumping of inhaled radiotracer about  the bilateral pulmonary hila. Ingested radiotracer seen within the hypopharynx, esophagus and stomach.  Perfusion: There are matched areas of relative oligemia involving the left costophrenic angle secondary to known small left-sided  pleural effusion. Otherwise, there is homogeneous distribution of injected radiotracer without discrete mismatched filling defects to suggest pulmonary embolism.  IMPRESSION: Pulmonary embolism absent (very low probability for pulmonary embolism).   Electronically Signed   By: Sandi Mariscal M.D.   On: 11/28/2013 14:20    Labs: BMET  Recent Labs Lab 11/22/13 1840 11/23/13 0935 11/24/13 0240 11/25/13 0838 11/25/13 1650 11/26/13 1240 11/27/13 1200 11/28/13 0700  NA 132* 134* 134* 137 136* 137 136* 138  K 4.8 4.6 4.8 4.0 4.0 3.8 4.0 4.6  CL 101 100 97 94* 93* 96 95* 97  CO2 14* 17* _0 GLUCOSE 338* 197* 252* 118* 208* 209* 142* 181*  BUN 73* 70* 69* 63* 61* 59* 60* 64*  CREATININE 6.18* 5.94* 5.65* 5.47* 5.50* 5.39* 5.21* 5.41*  CALCIUM 7.5* 7.4* 7.2* 7.2* 7.2* 7.2* 7.3* 8.0*  PHOS  --  3.9 3.8 3.9 3.7 3.4  --  4.1   CBC  Recent Labs Lab 11/25/13 0500 11/26/13 0500 11/27/13 1200 11/28/13 0700  WBC 3.5* 3.5* 3.6* 5.7  NEUTROABS 2.5  --   --   --   HGB 7.4* 7.1* 6.5* 9.4*  HCT 21.5* 20.8* 19.5* 27.5*  MCV 103.4* 103.5* 106.0* 98.9  PLT 72* 66* 66* 78*    Medications:    . cyanocobalamin  1,000 mcg Intramuscular Daily  . folic acid  1 mg Oral Daily  . heparin  5,000 Units Subcutaneous 3 times per day  . insulin aspart  0-15 Units Subcutaneous TID WC  . insulin aspart  0-5 Units Subcutaneous QHS  . insulin glargine  10 Units Subcutaneous Daily  . pantoprazole  40 mg Oral Q1200   Elmarie Shiley, MD 11/29/2013, 10:22 AM

## 2013-11-29 NOTE — Progress Notes (Signed)
PATIENT DETAILS Name: Micheal Goodman Age: 63 y.o. Sex: male Date of Birth: 1950/11/09 Admit Date: 11/21/2013 Admitting Physician Elsie Stain, MD NIO:EVOJJK,KXFG HENRY, MD  Brief narrative: Micheal Goodman is a 63 y.o. male presenting on 11/21/2013 with has a past medical history of Hypertension; Diabetes mellitus without complication; Arthritis; Anemia; and Basal cell carcinoma whowas transferred from [facility] after presenting there with syncope and hypoglycemia. Pt denies any history of renal insufficiency though Scr 5/14 was 1.5. On presentation Scr was 7.4. He was briefly started on IV fluids, was also found to have anemia and thrombocytopenia, nephrology and hematology has been consulted. We'll function is slowly improving, work up for anemia is in progress  Subjective: No complaints. Patient was supposed to be discharged to nursing home yesterday, insurance company yet to authorize it.  Assessment/Plan:  Acute renal failure/metabolic acidosis  - Patient was admitted, briefly required IV bicarbonate infusion. Workup underway, however current suspicion for ATN secondary to hypotension and ARB use. However given anemia and thrombocytopenia, multiple myeloma in the differential, SPEP pending. - Renal ultrasound on 3/15 negative for hydronephrosis, UA on 3/14 negative for proteinuria. -Creatinine is still in the high side, 5.2 today, nonoliguric.  Hypoxia -Elevated d-dimer but VQ scan is negative for PE. -Chest x-ray showed vascular congestion, IV fluid discontinued, Lasix given x1. -Patient is not short of breath, check ABG.  Anemia-thrombocytopenia  -On admission found to have new onset of anemia and thrombocytopenia, workup underway, and DIC panel negative, no evidence of microangiopathy therefore unlikely to be TTP, however ADAMTS13 is not consistent with TTP - Does have low vitamin B12 and folate levels, has been started on vitamin B12 and folate  supplementation. -Given renal failure, SPEP pending, hematology following some suspicion for myelodysplastic syndrome as well. -Hemoglobin went down to 6.7, status post transfusion of 2 units of RBCs hemoglobin is 9.4.  Elevated d dimer  -Duplex negative for DVT in legs, patient is hypoxic today, requires 2-3 L of oxygen to keep sats over 90%. -Chest x-ray showed vascular congestion, low probability VQ scan.   Hypoglycemia  - Patient was hypoglycemic on presentation - This is resolved, likely secondary to worsening renal function and the use of Lantus another oral hypoglycemic medications  History of diabetes - Hemoglobin A1c 6.6, given renal failure and hypoglycemic presentation would allow some permissive hypoglycemia. - Started back on Lantus and SSI, CBGs controlled, continue to monitor closely  Hypertension  - Blood pressure currently controlled without the use of any antihypertensive medications. Continue to monitor and resume when able.  Hypothermia  - resolved- no infectious source found, likely secondary to hypoglycemia  Disposition: Remain inpatient SNF  DVT Prophylaxis: Prophylactic Heparin - watch platelets  Code Status: Full code   Family Communication None at bedside  Procedures:  NonE  CONSULTS:  nephrology and hematology/oncology  Time spent 40 minutes-which includes 50% of the time with face-to-face with patient/ family and coordinating care related to the above assessment and plan.    MEDICATIONS: Scheduled Meds: . cyanocobalamin  1,000 mcg Intramuscular Daily  . folic acid  1 mg Oral Daily  . heparin  5,000 Units Subcutaneous 3 times per day  . insulin aspart  0-15 Units Subcutaneous TID WC  . insulin aspart  0-5 Units Subcutaneous QHS  . insulin glargine  10 Units Subcutaneous Daily  . pantoprazole  40 mg Oral Q1200   Continuous Infusions:   PRN Meds:.  Antibiotics: Anti-infectives  Start     Dose/Rate Route Frequency Ordered Stop    11/22/13 0600  piperacillin-tazobactam (ZOSYN) IVPB 2.25 g  Status:  Discontinued     2.25 g 100 mL/hr over 30 Minutes Intravenous 3 times per day 11/21/13 1904 11/22/13 0905   11/21/13 1615  piperacillin-tazobactam (ZOSYN) IVPB 3.375 g  Status:  Discontinued     3.375 g 12.5 mL/hr over 240 Minutes Intravenous  Once 11/21/13 1603 11/22/13 1511   11/21/13 1615  vancomycin (VANCOCIN) IVPB 1000 mg/200 mL premix     1,000 mg 200 mL/hr over 60 Minutes Intravenous  Once 11/21/13 1603 11/21/13 1738       PHYSICAL EXAM: Vital signs in last 24 hours: Filed Vitals:   11/28/13 1247 11/28/13 2101 11/29/13 0500 11/29/13 0626  BP:  138/76  117/71  Pulse:  98  89  Temp:  98.1 F (36.7 C)  98.2 F (36.8 C)  TempSrc:  Oral  Oral  Resp:  20  20  Height:      Weight:   67.6 kg (149 lb 0.5 oz)   SpO2: 90% 90%  92%    Weight change: 0.1 kg (3.5 oz) Filed Weights   11/27/13 0525 11/28/13 0500 11/29/13 0500  Weight: 67.9 kg (149 lb 11.1 oz) 67.5 kg (148 lb 13 oz) 67.6 kg (149 lb 0.5 oz)   Body mass index is 21.38 kg/(m^2).   Gen Exam: Awake and alert with clear speech.   Neck: Supple, No JVD.   Chest: B/L Clear.   CVS: S1 S2 Regular, no murmurs.  Abdomen: soft, BS +, non tender, non distended.  Extremities: no edema, lower extremities warm to touch. Neurologic: Non Focal.   Skin: No Rash.   Wounds: N/A.   Intake/Output from previous day:  Intake/Output Summary (Last 24 hours) at 11/29/13 0959 Last data filed at 11/29/13 0900  Gross per 24 hour  Intake    422 ml  Output   1450 ml  Net  -1028 ml     LAB RESULTS: CBC  Recent Labs Lab 11/23/13 0353 11/25/13 0500 11/26/13 0500 11/27/13 1200 11/28/13 0700  WBC 3.0* 3.5* 3.5* 3.6* 5.7  HGB 7.6* 7.4* 7.1* 6.5* 9.4*  HCT 21.7* 21.5* 20.8* 19.5* 27.5*  PLT 73* 72* 66* 66* 78*  MCV 102.4* 103.4* 103.5* 106.0* 98.9  MCH 35.8* 35.6* 35.3* 35.3* 33.8  MCHC 35.0 34.4 34.1 33.3 34.2  RDW 18.8* 17.4* 17.1* 17.7* 20.4*  LYMPHSABS   --  0.6*  --   --   --   MONOABS  --  0.3  --   --   --   EOSABS  --  0.1  --   --   --   BASOSABS  --  0.0  --   --   --     Chemistries   Recent Labs Lab 11/25/13 0838 11/25/13 1650 11/26/13 0500 11/26/13 1240 11/27/13 0651 11/27/13 1200 11/28/13 0700  NA 137 136*  --  137  --  136* 138  K 4.0 4.0  --  3.8  --  4.0 4.6  CL 94* 93*  --  96  --  95* 97  CO2 28 29  --  25  --  26 24  GLUCOSE 118* 208*  --  209*  --  142* 181*  BUN 63* 61*  --  59*  --  60* 64*  CREATININE 5.47* 5.50*  --  5.39*  --  5.21* 5.41*  CALCIUM 7.2* 7.2*  --  7.2*  --  7.3* 8.0*  MG  --   --  1.5  --  1.4*  --  1.9    CBG:  Recent Labs Lab 11/28/13 1142 11/28/13 1553 11/28/13 1759 11/28/13 2203 11/29/13 0751  GLUCAP 143* 106* 140* 205* 105*    GFR Estimated Creatinine Clearance: 13.7 ml/min (by C-G formula based on Cr of 5.41).  Coagulation profile  Recent Labs Lab 11/22/13 1237  INR 1.10    Cardiac Enzymes No results found for this basename: CK, CKMB, TROPONINI, MYOGLOBIN,  in the last 168 hours  No components found with this basename: POCBNP,  No results found for this basename: DDIMER,  in the last 72 hours No results found for this basename: HGBA1C,  in the last 72 hours No results found for this basename: CHOL, HDL, LDLCALC, TRIG, CHOLHDL, LDLDIRECT,  in the last 72 hours No results found for this basename: TSH, T4TOTAL, FREET3, T3FREE, THYROIDAB,  in the last 72 hours  Recent Labs  11/26/13 1240  FOLATE 3.9   No results found for this basename: LIPASE, AMYLASE,  in the last 72 hours  Urine Studies No results found for this basename: UACOL, UAPR, USPG, UPH, UTP, UGL, UKET, UBIL, UHGB, UNIT, UROB, ULEU, UEPI, UWBC, URBC, UBAC, CAST, CRYS, UCOM, BILUA,  in the last 72 hours  MICROBIOLOGY: Recent Results (from the past 240 hour(s))  CULTURE, BLOOD (ROUTINE X 2)     Status: None   Collection Time    11/21/13  1:50 PM      Result Value Ref Range Status   Specimen  Description BLOOD RIGHT ARM   Final   Special Requests     Final   Value: Immunocompromised BOTTLES DRAWN AEROBIC AND ANAEROBIC 5CC EACH   Culture  Setup Time     Final   Value: 11/21/2013 20:43     Performed at [facility]   Culture     Final   Value: NO GROWTH 5 DAYS     Performed at [facility]   Report Status 11/27/2013 FINAL   Final  CULTURE, BLOOD (ROUTINE X 2)     Status: None   Collection Time    11/21/13  2:15 PM      Result Value Ref Range Status   Specimen Description BLOOD RIGHT RADIAL   Final   Special Requests     Final   Value: Immunocompromised BOTTLES DRAWN AEROBIC AND ANAEROBIC 5CC EACH   Culture  Setup Time     Final   Value: 11/21/2013 20:43     Performed at [facility]   Culture     Final   Value: NO GROWTH 5 DAYS     Performed at [facility]   Report Status 11/27/2013 FINAL   Final  MRSA PCR SCREENING     Status: Abnormal   Collection Time    11/21/13  6:21 PM      Result Value Ref Range Status   MRSA by PCR POSITIVE (*) NEGATIVE Final   Comment:            The GeneXpert MRSA Assay (FDA     approved for NASAL specimens     only), is one component of a     comprehensive MRSA colonization     surveillance program. It is not     intended to diagnose MRSA     infection nor to guide or     monitor treatment for  MRSA infections.     RESULT CALLED TO, READ BACK BY AND VERIFIED WITH:     Pincus Sanes RN [id number] 11/21/13 A BROWNING    RADIOLOGY STUDIES/RESULTS: Ct Head Wo Contrast  11/21/2013   CLINICAL DATA:  Altered mental status  EXAM: CT HEAD WITHOUT CONTRAST  TECHNIQUE: Contiguous axial images were obtained from the base of the skull through the vertex without intravenous contrast. Study was obtained within 24 hr of patient's arrival at the emergency department.  COMPARISON:  None.  FINDINGS: There is moderate diffuse atrophy. There is no mass, hemorrhage, extra-axial fluid collection, or midline shift. There is mild  small vessel disease in the centra semiovale bilaterally. Gray-white compartments elsewhere appear normal. There is no demonstrable acute infarct.  Bony calvarium appears intact. The mastoid air cells are clear. There is opacification of multiple ethmoid sinuses bilaterally. There is mild mucosal thickening in both maxillary antra.  IMPRESSION: Moderate diffuse atrophy with mild periventricular small vessel disease. No intracranial mass, hemorrhage or acute appearing infarct. Multifocal paranasal sinus disease bilaterally.   Electronically Signed   By: Lowella Grip M.D.   On: 11/21/2013 15:49   US Renal Port  11/23/2013   CLINICAL DATA:  Acute renal failure.  EXAM: RENAL/URINARY TRACT ULTRASOUND COMPLETE  COMPARISON:  US RENAL dated 08/26/2012  FINDINGS: Right Kidney:  Length: 12.5 cm. Increased echogenicity. Multiple small simple appearing renal cysts. No hydronephrosis.  Left Kidney:  Length: 12.6 cm. Increased echogenicity. Multiple small simple appearing cysts. No hydronephrosis.  Bladder:  Collapsed around a Foley catheter.  IMPRESSION: 1.  No hydronephrosis. 2. Increased renal echogenicity, suggesting medical renal disease.   Electronically Signed   By: Abigail Miyamoto M.D.   On: 11/23/2013 12:37   Dg Chest Port 1 View  11/22/2013   CLINICAL DATA:  Atelectasis  EXAM: PORTABLE CHEST - 1 VIEW  COMPARISON:  Chest x-ray from yesterday  FINDINGS: The previously reported nodular density in the left mid lung faintly persists. It is ovoid, with the largest dimension measuring 69mm. There is no edema or lobar consolidation. No evidence of effusion (as permitted by exclusion of lateral costophrenic sulci sign) or pneumothorax. Normal heart size.  IMPRESSION: Faintly persistent 2 cm nodule in the left chest. Chest CT followup is recommended to exclude a nodule. Preferably, this would be accomplished on an outpatient basis - when there has been time for clearing of any inflammatory or infectious infiltrates.    Electronically Signed   By: Jorje Guild M.D.   On: 11/22/2013 08:17   Dg Chest Port 1 View  11/21/2013   CLINICAL DATA:  Weakness  EXAM: PORTABLE CHEST - 1 VIEW  COMPARISON:  None.  FINDINGS: Cardiac shadow is within normal limits. The lungs are well aerated bilaterally. There is a questionable nodular density in the left mid lung superimposed over the anterior aspect of the third and fourth ribs. It measures approximately 17 mm. This may be related to overlying artifact. Short-term followup to assess for resolution is recommended.  IMPRESSION: Questionable nodular density in the left mid lung. Short-term followup is recommended. If the lesion persists, nonemergent CT of the chest is recommended for further evaluation.   Electronically Signed   By: Inez Catalina M.D.   On: 11/21/2013 15:24    Jaclene Bartelt A, MD  [facility] Pager:[phone]  If 7PM-7AM, please contact night-coverage [email or web address] Password [email or web address] 11/29/2013, 9:59 AM   LOS: 8 days

## 2013-11-29 NOTE — Progress Notes (Signed)
Patient's Sp02 90% on room air at rest. Pt's Sp02 90-92% on room air while ambulating in hall. Patient tolerated ambulation well.

## 2013-11-30 LAB — CBC
HEMATOCRIT: 23.8 % — AB (ref 39.0–52.0)
HEMOGLOBIN: 8.1 g/dL — AB (ref 13.0–17.0)
MCH: 33.8 pg (ref 26.0–34.0)
MCHC: 34 g/dL (ref 30.0–36.0)
MCV: 99.2 fL (ref 78.0–100.0)
Platelets: 92 10*3/uL — ABNORMAL LOW (ref 150–400)
RBC: 2.4 MIL/uL — ABNORMAL LOW (ref 4.22–5.81)
RDW: 19.1 % — ABNORMAL HIGH (ref 11.5–15.5)
WBC: 3.2 10*3/uL — ABNORMAL LOW (ref 4.0–10.5)

## 2013-11-30 LAB — GLUCOSE, CAPILLARY
GLUCOSE-CAPILLARY: 236 mg/dL — AB (ref 70–99)
GLUCOSE-CAPILLARY: 195 mg/dL — AB (ref 70–99)
GLUCOSE-CAPILLARY: 156 mg/dL — AB (ref 70–99)
Glucose-Capillary: 128 mg/dL — ABNORMAL HIGH (ref 70–99)

## 2013-11-30 LAB — RENAL FUNCTION PANEL
Albumin: 2.1 g/dL — ABNORMAL LOW (ref 3.5–5.2)
BUN: 66 mg/dL — ABNORMAL HIGH (ref 6–23)
CO2: 24 meq/L (ref 19–32)
CREATININE: 5.05 mg/dL — AB (ref 0.50–1.35)
Calcium: 8 mg/dL — ABNORMAL LOW (ref 8.4–10.5)
Chloride: 94 mEq/L — ABNORMAL LOW (ref 96–112)
GFR calc Af Amer: 13 mL/min — ABNORMAL LOW (ref >90)
GFR calc non Af Amer: 11 mL/min — ABNORMAL LOW (ref >90)
GLUCOSE: 216 mg/dL — AB (ref 70–99)
PHOSPHORUS: 3.7 mg/dL (ref 2.3–4.6)
Potassium: 4.2 mEq/L (ref 3.7–5.3)
Sodium: 133 mEq/L — ABNORMAL LOW (ref 137–147)

## 2013-11-30 MED ORDER — INSULIN GLARGINE 100 UNIT/ML ~~LOC~~ SOLN
15.0000 [IU] | Freq: Every day | SUBCUTANEOUS | Status: DC
  Administered 2013-11-30 – 2013-12-01 (×2): 15 [IU] via SUBCUTANEOUS
  Filled 2013-11-30 (×2): qty 0.15

## 2013-11-30 NOTE — Progress Notes (Signed)
Patient ID: Micheal Goodman, male   DOB: [birth year], 63 y.o.   MRN: [id number]  [facility] Progress Note    Assessment/ Plan:   1. ARF-non-oliguric on CKD 3: Likely ATN from hypotension/ARB. Renal function improving sluggishly with good UOP and no emerging dialysis needs. Clinically, appears to be close to euvolemic and without any compelling need for diuretics.  2. Hypertension,Initially hypotensive on admission BP improved off anti-HTN Rx and s/p IVFs. BPs appear acceptable off treatment  3. Pancytopenia: seen by hematology and folate/B12 started- plasma cell dyscrasia screening negative and plans noted for bone marrow biopsy to evaluate for MDS. No evidence of MAHA/hemolysis. 4. Deconditioning: awaiting SNF admission  5. Hypoxemia: Patient noted to have prominent desaturation of oxygen therapy. Chest x-ray that shows mild vascular congestion and had a VQ scan done that was low probability for PE. No therapeutic changes at this time.   Subjective:   Reports to be feeling fair- denies CP/emerging complaints   Objective:   BP 138/66  Pulse 100  Temp(Src) 98.2 F (36.8 C) (Oral)  Resp 18  Ht $R'5\' 10"'sh$  (1.778 m)  Wt 67 kg (147 lb 11.3 oz)  BMI 21.19 kg/m2  SpO2 92%  Intake/Output Summary (Last 24 hours) at 11/30/13 1106 Last data filed at 11/30/13 4098  Gross per 24 hour  Intake    444 ml  Output   1050 ml  Net   -606 ml   Weight change: -0.6 kg (-1 lb 5.2 oz)  Physical Exam: Gen: Comfortably resting up in his recliner CVS: Pulse regular in rate and rhythm, S1 and S2 normal  Resp: Decreased breath sounds of both bases-no distinct rales or rhonchi  Abd: Soft, obese, nontender and bowel sounds are normal  Ext: No lower extremity edema   Imaging: Nm Pulmonary Perf And Vent  11/28/2013   CLINICAL DATA:  Shortness of breath, evaluate for pulmonary embolism  EXAM: NUCLEAR MEDICINE VENTILATION - PERFUSION LUNG SCAN  TECHNIQUE: Ventilation images were obtained in  multiple projections using inhaled aerosol technetium 99 M DTPA. Perfusion images were obtained in multiple projections after intravenous injection of Tc-24m MAA.  RADIOPHARMACEUTICALS:  40 mCi Tc-8m DTPA aerosol and 6 mCi Tc-45m MAA  COMPARISON:  DG CHEST 2 VIEW dated 11/27/2013; DG CHEST 1V PORT dated 11/21/2013  FINDINGS: Review of chest radiograph performed earlier same day demonstrates grossly unchanged cardiac silhouette and mediastinal contours. Small bilateral pleural effusions, left greater than right. Mild pulmonary edema superimposed on advanced at the sinus change. Minimal basilar opacities, left greater than right, likely atelectasis.  Ventilation: There is suboptimal ventilation of the bilateral upper lobes as well as the bilateral lower lungs, left greater than right, secondary to unknown bilateral pleural effusions. There is minimal clumping of inhaled radiotracer about the bilateral pulmonary hila. Ingested radiotracer seen within the hypopharynx, esophagus and stomach.  Perfusion: There are matched areas of relative oligemia involving the left costophrenic angle secondary to known small left-sided pleural effusion. Otherwise, there is homogeneous distribution of injected radiotracer without discrete mismatched filling defects to suggest pulmonary embolism.  IMPRESSION: Pulmonary embolism absent (very low probability for pulmonary embolism).   Electronically Signed   By: Sandi Mariscal M.D.   On: 11/28/2013 14:20    Labs: BMET  Recent Labs Lab 11/24/13 0240 11/25/13 0838 11/25/13 1650 11/26/13 1240 11/27/13 1200 11/28/13 0700 11/29/13 1140 11/30/13 0715  NA 134* 137 136* 137 136* 138 134* 133*  K 4.8 4.0 4.0 3.8 4.0 4.6 3.8 4.2  CL  97 94* 93* 96 95* 97 94* 94*  CO2 $Re'22 28 29 25 26 24 24 24  'mvo$ GLUCOSE 252* 118* 208* 209* 142* 181* 221* 216*  BUN 69* 63* 61* 59* 60* 64* 64* 66*  CREATININE 5.65* 5.47* 5.50* 5.39* 5.21* 5.41* 5.25* 5.05*  CALCIUM 7.2* 7.2* 7.2* 7.2* 7.3* 8.0* 8.0* 8.0*   PHOS 3.8 3.9 3.7 3.4  --  4.1 3.9 3.7   CBC  Recent Labs Lab 11/25/13 0500 11/26/13 0500 11/27/13 1200 11/28/13 0700 11/30/13 0715  WBC 3.5* 3.5* 3.6* 5.7 3.2*  NEUTROABS 2.5  --   --   --   --   HGB 7.4* 7.1* 6.5* 9.4* 8.1*  HCT 21.5* 20.8* 19.5* 27.5* 23.8*  MCV 103.4* 103.5* 106.0* 98.9 99.2  PLT 72* 66* 66* 78* 92*   Medications:    . cyanocobalamin  1,000 mcg Intramuscular Daily  . folic acid  1 mg Oral Daily  . heparin  5,000 Units Subcutaneous 3 times per day  . insulin aspart  0-15 Units Subcutaneous TID WC  . insulin aspart  0-5 Units Subcutaneous QHS  . insulin glargine  15 Units Subcutaneous Daily  . pantoprazole  40 mg Oral Q1200   Elmarie Shiley, MD 11/30/2013, 11:06 AM

## 2013-11-30 NOTE — Progress Notes (Signed)
PATIENT DETAILS Name: Micheal Goodman Age: 63 y.o. Sex: male Date of Birth: 1951/05/17 Admit Date: 11/21/2013 Admitting Physician Elsie Stain, MD AOZ:HYQMVH,QION HENRY, MD  Brief narrative: Micheal Goodman is a 63 y.o. male presenting on 11/21/2013 with has a past medical history of Hypertension; Diabetes mellitus without complication; Arthritis; Anemia; and Basal cell carcinoma whowas transferred from [facility] after presenting there with syncope and hypoglycemia. Pt denies any history of renal insufficiency though Scr 5/14 was 1.5. On presentation Scr was 7.4. He was briefly started on IV fluids, was also found to have anemia and thrombocytopenia, nephrology and hematology has been consulted. We'll function is slowly improving, work up for anemia is in progress  Subjective: No complaints. Will ambulate.  Assessment/Plan:  Acute renal failure/metabolic acidosis  - Patient was admitted, briefly required IV bicarbonate infusion. Workup underway, however current suspicion for ATN secondary to hypotension and ARB use. However given anemia and thrombocytopenia, multiple myeloma in the differential, SPEP pending. - Renal ultrasound on 3/15 negative for hydronephrosis, UA on 3/14 negative for proteinuria. -Creatinine is still in the high side, 5.2 today, nonoliguric.  Hypoxia -Elevated d-dimer but VQ scan is negative for PE. -Chest x-ray showed vascular congestion, IV fluid discontinued, Lasix given x1. -Patient is not short of breath, ABG on RA did not show any hypoxemia.  Anemia-thrombocytopenia  -On admission found to have new onset of anemia and thrombocytopenia, workup underway, and DIC panel negative, no evidence of microangiopathy therefore unlikely to be TTP, however ADAMTS13 is not consistent with TTP - Does have low vitamin B12 and folate levels, has been started on vitamin B12 and folate supplementation. -Given renal failure, SPEP pending, hematology following  some suspicion for myelodysplastic syndrome as well. -Hemoglobin went down to 6.7, status post transfusion of 2 units of RBCs hemoglobin is 9.4.  Elevated d dimer  -Duplex negative for DVT in legs, patient is hypoxic today, requires 2-3 L of oxygen to keep sats over 90%. -Chest x-ray showed vascular congestion, low probability VQ scan.   Hypoglycemia  - Patient was hypoglycemic on presentation - This is resolved, likely secondary to worsening renal function and the use of Lantus another oral hypoglycemic medications  History of diabetes - Hemoglobin A1c 6.6, given renal failure and hypoglycemic presentation would allow some permissive hypoglycemia. - Started back on Lantus and SSI, CBGs controlled, continue to monitor closely. - Increase Lantus dose.  Hypertension  - Blood pressure currently controlled without the use of any antihypertensive medications. Continue to monitor and resume when able.  Hypothermia  - resolved- no infectious source found, likely secondary to hypoglycemia  Disposition: Remain inpatient SNF  DVT Prophylaxis: Prophylactic Heparin - watch platelets  Code Status: Full code   Family Communication None at bedside  Procedures:  NonE  CONSULTS:  nephrology and hematology/oncology  Time spent 40 minutes-which includes 50% of the time with face-to-face with patient/ family and coordinating care related to the above assessment and plan.    MEDICATIONS: Scheduled Meds: . cyanocobalamin  1,000 mcg Intramuscular Daily  . folic acid  1 mg Oral Daily  . heparin  5,000 Units Subcutaneous 3 times per day  . insulin aspart  0-15 Units Subcutaneous TID WC  . insulin aspart  0-5 Units Subcutaneous QHS  . insulin glargine  10 Units Subcutaneous Daily  . pantoprazole  40 mg Oral Q1200   Continuous Infusions:   PRN Meds:.  Antibiotics: Anti-infectives   Start  Dose/Rate Route Frequency Ordered Stop   11/22/13 0600  piperacillin-tazobactam (ZOSYN)  IVPB 2.25 g  Status:  Discontinued     2.25 g 100 mL/hr over 30 Minutes Intravenous 3 times per day 11/21/13 1904 11/22/13 0905   11/21/13 1615  piperacillin-tazobactam (ZOSYN) IVPB 3.375 g  Status:  Discontinued     3.375 g 12.5 mL/hr over 240 Minutes Intravenous  Once 11/21/13 1603 11/22/13 1511   11/21/13 1615  vancomycin (VANCOCIN) IVPB 1000 mg/200 mL premix     1,000 mg 200 mL/hr over 60 Minutes Intravenous  Once 11/21/13 1603 11/21/13 1738       PHYSICAL EXAM: Vital signs in last 24 hours: Filed Vitals:   11/29/13 1719 11/29/13 2123 11/30/13 0525 11/30/13 0910  BP:  115/67 138/66   Pulse:  94 90 100  Temp:  97.8 F (36.6 C) 98.2 F (36.8 C)   TempSrc:  Oral Oral   Resp:  18 18   Height:      Weight:   67 kg (147 lb 11.3 oz)   SpO2: 93% 92% 92%     Weight change: -0.6 kg (-1 lb 5.2 oz) Filed Weights   11/28/13 0500 11/29/13 0500 11/30/13 0525  Weight: 67.5 kg (148 lb 13 oz) 67.6 kg (149 lb 0.5 oz) 67 kg (147 lb 11.3 oz)   Body mass index is 21.19 kg/(m^2).   Gen Exam: Awake and alert with clear speech.   Neck: Supple, No JVD.   Chest: B/L Clear.   CVS: S1 S2 Regular, no murmurs.  Abdomen: soft, BS +, non tender, non distended.  Extremities: no edema, lower extremities warm to touch. Neurologic: Non Focal.   Skin: No Rash.   Wounds: N/A.   Intake/Output from previous day:  Intake/Output Summary (Last 24 hours) at 11/30/13 0925 Last data filed at 11/30/13 0906  Gross per 24 hour  Intake    564 ml  Output   1050 ml  Net   -486 ml     LAB RESULTS: CBC  Recent Labs Lab 11/25/13 0500 11/26/13 0500 11/27/13 1200 11/28/13 0700 11/30/13 0715  WBC 3.5* 3.5* 3.6* 5.7 3.2*  HGB 7.4* 7.1* 6.5* 9.4* 8.1*  HCT 21.5* 20.8* 19.5* 27.5* 23.8*  PLT 72* 66* 66* 78* 92*  MCV 103.4* 103.5* 106.0* 98.9 99.2  MCH 35.6* 35.3* 35.3* 33.8 33.8  MCHC 34.4 34.1 33.3 34.2 34.0  RDW 17.4* 17.1* 17.7* 20.4* 19.1*  LYMPHSABS 0.6*  --   --   --   --   MONOABS 0.3  --    --   --   --   EOSABS 0.1  --   --   --   --   BASOSABS 0.0  --   --   --   --     Chemistries   Recent Labs Lab 11/25/13 1650 11/26/13 0500 11/26/13 1240 11/27/13 0651 11/27/13 1200 11/28/13 0700 11/29/13 1140 11/30/13 0715  NA 136*  --  137  --  136* 138 134* 133*  K 4.0  --  3.8  --  4.0 4.6 3.8 4.2  CL 93*  --  96  --  95* 97 94* 94*  CO2 29  --  25  --  $R'26 24 24 24  'Vj$ GLUCOSE 208*  --  209*  --  142* 181* 221* 216*  BUN 61*  --  59*  --  60* 64* 64* 66*  CREATININE 5.50*  --  5.39*  --  5.21* 5.41* 5.25*  5.05*  CALCIUM 7.2*  --  7.2*  --  7.3* 8.0* 8.0* 8.0*  MG  --  1.5  --  1.4*  --  1.9  --   --     CBG:  Recent Labs Lab 11/29/13 0751 11/29/13 1147 11/29/13 1742 11/29/13 2220 11/30/13 0732  GLUCAP 105* 219* 209* 193* 236*    GFR Estimated Creatinine Clearance: 14.6 ml/min (by C-G formula based on Cr of 5.05).  Coagulation profile No results found for this basename: INR, PROTIME,  in the last 168 hours  Cardiac Enzymes No results found for this basename: CK, CKMB, TROPONINI, MYOGLOBIN,  in the last 168 hours  No components found with this basename: POCBNP,  No results found for this basename: DDIMER,  in the last 72 hours No results found for this basename: HGBA1C,  in the last 72 hours No results found for this basename: CHOL, HDL, LDLCALC, TRIG, CHOLHDL, LDLDIRECT,  in the last 72 hours No results found for this basename: TSH, T4TOTAL, FREET3, T3FREE, THYROIDAB,  in the last 72 hours No results found for this basename: VITAMINB12, FOLATE, FERRITIN, TIBC, IRON, RETICCTPCT,  in the last 72 hours No results found for this basename: LIPASE, AMYLASE,  in the last 72 hours  Urine Studies No results found for this basename: UACOL, UAPR, USPG, UPH, UTP, UGL, UKET, UBIL, UHGB, UNIT, UROB, ULEU, UEPI, UWBC, URBC, UBAC, CAST, CRYS, UCOM, BILUA,  in the last 72 hours  MICROBIOLOGY: Recent Results (from the past 240 hour(s))  CULTURE, BLOOD (ROUTINE X 2)      Status: None   Collection Time    11/21/13  1:50 PM      Result Value Ref Range Status   Specimen Description BLOOD RIGHT ARM   Final   Special Requests     Final   Value: Immunocompromised BOTTLES DRAWN AEROBIC AND ANAEROBIC 5CC EACH   Culture  Setup Time     Final   Value: 11/21/2013 20:43     Performed at [facility]   Culture     Final   Value: NO GROWTH 5 DAYS     Performed at [facility]   Report Status 11/27/2013 FINAL   Final  CULTURE, BLOOD (ROUTINE X 2)     Status: None   Collection Time    11/21/13  2:15 PM      Result Value Ref Range Status   Specimen Description BLOOD RIGHT RADIAL   Final   Special Requests     Final   Value: Immunocompromised BOTTLES DRAWN AEROBIC AND ANAEROBIC 5CC EACH   Culture  Setup Time     Final   Value: 11/21/2013 20:43     Performed at [facility]   Culture     Final   Value: NO GROWTH 5 DAYS     Performed at [facility]   Report Status 11/27/2013 FINAL   Final  MRSA PCR SCREENING     Status: Abnormal   Collection Time    11/21/13  6:21 PM      Result Value Ref Range Status   MRSA by PCR POSITIVE (*) NEGATIVE Final   Comment:            The GeneXpert MRSA Assay (FDA     approved for NASAL specimens     only), is one component of a     comprehensive MRSA colonization     surveillance program. It is not     intended  to diagnose MRSA     infection nor to guide or     monitor treatment for     MRSA infections.     RESULT CALLED TO, READ BACK BY AND VERIFIED WITH:     Janace Hoard RN 2225 11/21/13 A BROWNING    RADIOLOGY STUDIES/RESULTS: Ct Head Wo Contrast  11/21/2013   CLINICAL DATA:  Altered mental status  EXAM: CT HEAD WITHOUT CONTRAST  TECHNIQUE: Contiguous axial images were obtained from the base of the skull through the vertex without intravenous contrast. Study was obtained within 24 hr of patient's arrival at the emergency department.  COMPARISON:  None.  FINDINGS: There is moderate diffuse  atrophy. There is no mass, hemorrhage, extra-axial fluid collection, or midline shift. There is mild small vessel disease in the centra semiovale bilaterally. Gray-white compartments elsewhere appear normal. There is no demonstrable acute infarct.  Bony calvarium appears intact. The mastoid air cells are clear. There is opacification of multiple ethmoid sinuses bilaterally. There is mild mucosal thickening in both maxillary antra.  IMPRESSION: Moderate diffuse atrophy with mild periventricular small vessel disease. No intracranial mass, hemorrhage or acute appearing infarct. Multifocal paranasal sinus disease bilaterally.   Electronically Signed   By: Bretta Bang M.D.   On: 11/21/2013 15:49   US Renal Port  11/23/2013   CLINICAL DATA:  Acute renal failure.  EXAM: RENAL/URINARY TRACT ULTRASOUND COMPLETE  COMPARISON:  US RENAL dated 08/26/2012  FINDINGS: Right Kidney:  Length: 12.5 cm. Increased echogenicity. Multiple small simple appearing renal cysts. No hydronephrosis.  Left Kidney:  Length: 12.6 cm. Increased echogenicity. Multiple small simple appearing cysts. No hydronephrosis.  Bladder:  Collapsed around a Foley catheter.  IMPRESSION: 1.  No hydronephrosis. 2. Increased renal echogenicity, suggesting medical renal disease.   Electronically Signed   By: Jeronimo Greaves M.D.   On: 11/23/2013 12:37   Dg Chest Port 1 View  11/22/2013   CLINICAL DATA:  Atelectasis  EXAM: PORTABLE CHEST - 1 VIEW  COMPARISON:  Chest x-ray from yesterday  FINDINGS: The previously reported nodular density in the left mid lung faintly persists. It is ovoid, with the largest dimension measuring 78mm. There is no edema or lobar consolidation. No evidence of effusion (as permitted by exclusion of lateral costophrenic sulci sign) or pneumothorax. Normal heart size.  IMPRESSION: Faintly persistent 2 cm nodule in the left chest. Chest CT followup is recommended to exclude a nodule. Preferably, this would be accomplished on an  outpatient basis - when there has been time for clearing of any inflammatory or infectious infiltrates.   Electronically Signed   By: Tiburcio Pea M.D.   On: 11/22/2013 08:17   Dg Chest Port 1 View  11/21/2013   CLINICAL DATA:  Weakness  EXAM: PORTABLE CHEST - 1 VIEW  COMPARISON:  None.  FINDINGS: Cardiac shadow is within normal limits. The lungs are well aerated bilaterally. There is a questionable nodular density in the left mid lung superimposed over the anterior aspect of the third and fourth ribs. It measures approximately 17 mm. This may be related to overlying artifact. Short-term followup to assess for resolution is recommended.  IMPRESSION: Questionable nodular density in the left mid lung. Short-term followup is recommended. If the lesion persists, nonemergent CT of the chest is recommended for further evaluation.   Electronically Signed   By: Alcide Clever M.D.   On: 11/21/2013 15:24    Nadir Vasques A, MD  [facility] Pager:[phone]  If 7PM-7AM, please contact night-coverage [email or web address]  Password [email or web address] 11/30/2013, 9:25 AM   LOS: 9 days

## 2013-12-01 LAB — CBC
HCT: 25.5 % — ABNORMAL LOW (ref 39.0–52.0)
Hemoglobin: 8.7 g/dL — ABNORMAL LOW (ref 13.0–17.0)
MCH: 34.4 pg — AB (ref 26.0–34.0)
MCHC: 34.1 g/dL (ref 30.0–36.0)
MCV: 100.8 fL — ABNORMAL HIGH (ref 78.0–100.0)
Platelets: 117 10*3/uL — ABNORMAL LOW (ref 150–400)
RBC: 2.53 MIL/uL — ABNORMAL LOW (ref 4.22–5.81)
RDW: 18.7 % — ABNORMAL HIGH (ref 11.5–15.5)
WBC: 3.6 10*3/uL — ABNORMAL LOW (ref 4.0–10.5)

## 2013-12-01 LAB — RENAL FUNCTION PANEL
ALBUMIN: 2.2 g/dL — AB (ref 3.5–5.2)
BUN: 66 mg/dL — ABNORMAL HIGH (ref 6–23)
CALCIUM: 8.3 mg/dL — AB (ref 8.4–10.5)
CHLORIDE: 97 meq/L (ref 96–112)
CO2: 25 mEq/L (ref 19–32)
CREATININE: 5.18 mg/dL — AB (ref 0.50–1.35)
GFR, EST AFRICAN AMERICAN: 13 mL/min — AB (ref >90)
GFR, EST NON AFRICAN AMERICAN: 11 mL/min — AB (ref >90)
Glucose, Bld: 141 mg/dL — ABNORMAL HIGH (ref 70–99)
Phosphorus: 4.2 mg/dL (ref 2.3–4.6)
Potassium: 4.4 mEq/L (ref 3.7–5.3)
SODIUM: 137 meq/L (ref 137–147)

## 2013-12-01 LAB — GLUCOSE, CAPILLARY
GLUCOSE-CAPILLARY: 138 mg/dL — AB (ref 70–99)
Glucose-Capillary: 160 mg/dL — ABNORMAL HIGH (ref 70–99)

## 2013-12-01 MED ORDER — INSULIN GLARGINE 100 UNIT/ML ~~LOC~~ SOLN: 15.0000 [IU] | Freq: Every day | SUBCUTANEOUS | Status: DC

## 2013-12-01 NOTE — Clinical Social Work Placement (Signed)
Clinical Social Work Department CLINICAL SOCIAL WORK PLACEMENT NOTE 12/01/2013  Patient:  [facility]  Account Number:  [id number] Admit date:  11/21/2013  Clinical Social Worker:  Lovey Newcomer  Date/time:  11/26/2013 08:47 PM  Clinical Social Work is seeking post-discharge placement for this patient at the following level of care:   [place]   (*CSW will update this form in Epic as items are completed)   11/26/2013  Patient/family provided with [facility] Department of Clinical Social Work's list of facilities offering this level of care within the geographic area requested by the patient (or if unable, by the patient's family).  11/26/2013  Patient/family informed of their freedom to choose among providers that offer the needed level of care, that participate in Medicare, Medicaid or managed care program needed by the patient, have an available bed and are willing to accept the patient.  11/26/2013  Patient/family informed of [facility]' ownership interest in [facility], as well as of the fact that they are under no obligation to receive care at this facility.  PASARR submitted to EDS on 11/26/2013 PASARR number received from EDS on 11/26/2013  FL2 transmitted to all facilities in geographic area requested by pt/family on  11/26/2013 FL2 transmitted to all facilities within larger geographic area on   Patient informed that his/her managed care company has contracts with or will negotiate with  certain facilities, including the following:     Patient/family informed of bed offers received:  11/27/2013 Patient chooses bed at [facility] [place] Physician recommends and patient chooses bed at    Patient to be transferred to [facility] on   Patient to be transferred to facility by   The following physician request were entered in Epic:   Additional Comments: Per MD patient ready to DC to [facility].  RN, patient, facility notified of DC. RN given number for report. DC packet on chart. Ambulance transport requested for patient. CSW signing off.   Liz Beach, [place], [place], [phone]

## 2013-12-01 NOTE — Progress Notes (Signed)
Patient ID: Micheal Goodman, male   DOB: 1951-02-17, 64 y.o.   MRN: [id number]  [facility] Progress Note    Assessment/ Plan:   1. ARF-non-oliguric on CKD 3: Likely ATN from hypotension/ARB. Renal function improving sluggishly with good UOP and no emerging dialysis needs. Clinically, appears to be close to euvolemic and without any compelling need for diuretics. Hopefully will still see improvement as OP.  Will arrange follow up with Dr. Posey Pronto Friday April 24th at 10:15 and OP labs on Thursday  2. Hypertension,Initially hypotensive on admission BP improved off anti-HTN Rx and s/p IVFs. BPs appear acceptable off treatment  3. Pancytopenia: seen by hematology and folate/B12 started- plasma cell dyscrasia screening negative and plans noted for bone marrow biopsy to evaluate for MDS. No evidence of MAHA/hemolysis. Is actually improving  4. Deconditioning: awaiting SNF admission  5. Hypoxemia: Patient noted to have prominent desaturation of oxygen therapy. Chest x-ray that shows mild vascular congestion and had a VQ scan done that was low probability for PE. No therapeutic changes at this time.   Subjective:   Reports to be feeling good- going to SNF today.     Objective:   BP 126/79  Pulse 89  Temp(Src) 98.7 F (37.1 C) (Oral)  Resp 18  Ht $R'5\' 10"'qC$  (1.778 m)  Wt 67 kg (147 lb 11.3 oz)  BMI 21.19 kg/m2  SpO2 93%  Intake/Output Summary (Last 24 hours) at 12/01/13 1056 Last data filed at 12/01/13 0520  Gross per 24 hour  Intake      0 ml  Output   1500 ml  Net  -1500 ml   Weight change: 0 kg (0 lb)  Physical Exam: Gen: Comfortably resting up in his recliner CVS: Pulse regular in rate and rhythm, S1 and S2 normal  Resp: Decreased breath sounds of both bases-no distinct rales or rhonchi  Abd: Soft, obese, nontender and bowel sounds are normal  Ext: No lower extremity edema   Imaging: No results found.  Labs: BMET  Recent Labs Lab 11/25/13 0838 11/25/13 1650  11/26/13 1240 11/27/13 1200 11/28/13 0700 11/29/13 1140 11/30/13 0715 12/01/13 0732  NA 137 136* 137 136* 138 134* 133* 137  K 4.0 4.0 3.8 4.0 4.6 3.8 4.2 4.4  CL 94* 93* 96 95* 97 94* 94* 97  CO2 $Re'28 29 25 26 24 24 24 25  'JRs$ GLUCOSE 118* 208* 209* 142* 181* 221* 216* 141*  BUN 63* 61* 59* 60* 64* 64* 66* 66*  CREATININE 5.47* 5.50* 5.39* 5.21* 5.41* 5.25* 5.05* 5.18*  CALCIUM 7.2* 7.2* 7.2* 7.3* 8.0* 8.0* 8.0* 8.3*  PHOS 3.9 3.7 3.4  --  4.1 3.9 3.7 4.2   CBC  Recent Labs Lab 11/25/13 0500  11/27/13 1200 11/28/13 0700 11/30/13 0715 12/01/13 0732  WBC 3.5*  < > 3.6* 5.7 3.2* 3.6*  NEUTROABS 2.5  --   --   --   --   --   HGB 7.4*  < > 6.5* 9.4* 8.1* 8.7*  HCT 21.5*  < > 19.5* 27.5* 23.8* 25.5*  MCV 103.4*  < > 106.0* 98.9 99.2 100.8*  PLT 72*  < > 66* 78* 92* 117*  < > = values in this interval not displayed. Medications:    . cyanocobalamin  1,000 mcg Intramuscular Daily  . folic acid  1 mg Oral Daily  . heparin  5,000 Units Subcutaneous 3 times per day  . insulin aspart  0-15 Units Subcutaneous TID WC  . insulin aspart  0-5 Units Subcutaneous QHS  . insulin glargine  15 Units Subcutaneous Daily  . pantoprazole  40 mg Oral Q1200   Justice Aguirre A   12/01/2013, 10:56 AM

## 2013-12-01 NOTE — Clinical Social Work Note (Signed)
Ambulance transport requested for patient to be transported to SNF.  Liz Beach, [place], [place], [phone]

## 2013-12-01 NOTE — Progress Notes (Signed)
Report given to Nurse at [facility]

## 2013-12-01 NOTE — Discharge Summary (Signed)
Physician Discharge Summary  Micheal Goodman PGD:[id number] DOB: [birth year] DOA: 11/21/2013  PCP: Pamelia Hoit, MD  Admit date: 11/21/2013 Discharge date: 12/01/2013  Time spent: 45 minutes  Recommendations for Outpatient Follow-up:   1. Followup with Dr. Allena Katz of nephrology as outpatient. 2. Please check oxygen sat daily, if patient needs more oxygen it might be an indicator that he is being fluid overloaded. 3. Check CBC and BMP within 5 days.   Discharge Diagnoses:  Principal Problem:   Acute renal failure Active Problems:   Anemia   Hypoglycemia   Hypothermia   DM (diabetes mellitus)   Thrombocytopenia, unspecified    History of present illness:  This morning patient states he feels well and stronger than he has felt since admission. He has no complaints and denies any chills, headache, dizziness, chest pain, SOB, abdominal pain, nausea or vomiting.  Hospital Course:  Micheal Goodman is a 63 y.o. male who presented on 11/21/2013 with has a past medical history of Hypertension; Diabetes mellitus without complication; Arthritis; Anemia; and Basal cell carcinoma whowas transferred from [facility] after presenting there with syncope and hypoglycemia. Pt denies any history of renal insufficiency though Scr 5/14 was 1.5. On presentation Scr was 7.4. He was briefly started on IV fluids, was also found to have anemia and thrombocytopenia, nephrology and hematology have been consulted. Renal function is slowly improving, work up for anemia is in progress.  Acute Renal Failure -Patient presented to the hospital with creatinine of 7.4, baseline creatinine from May of 2014 was 1.4. -Patient was admitted, briefly required IV bicarbonate infusion -Suspicion for ATN secondary to hypotension and ARB use.  -Given anemia and thrombocytopenia, multiple myeloma in the differential -SPEP was done did not show monoclonal spike -Renal ultrasound on 3/15 negative for hydronephrosis, UA on  3/14 negative for proteinuria. -Creatinine seems to plateau around 5.4, patient to followup with nephrology as outpatient, no indication for dialysis. -Follow closely if patient developed fluid overload or less urine output to return to the hospital.  Anemia/Thrombocytopenia -On admission found to have new onset of anemia and thrombocytopenia -DIC panel negative, no evidence of microangiopathy therefore. -ADAMTS 13 level not consistent with TTP -Recieved 1 unit of PRBC on 3/14; Low vitamin B12 and folate levels- continue supplementation.  -Recieved 2 units of PRBC on 3/19 for Hgb of 6.7; Hgb up to 9.4 on 3/20  Elevated d dimer  -Duplex negative for DVT in legs, patient is hypoxic today, requires 4 L of oxygen to keep sats over 90%.  -Chest x-ray on 3/19 showed bilateral pleural effusions likely due to fluid overload -STAT VQ scan om 3/20 showed low probability for PE -Patient stable without any complaints of dizziness, lightheadedness, chest pain, SOB, or difficulty breathing. -On discharge patient required about 3 L of oxygen to keep saturation about 93%.  Hypoglycemia -Patient found to be hypoglycemic on presentation with a CBG of 32 -This is resolved likely secondary to worsening renal function and use of lantus and hypoglycemic medications. -On discharge I restarted Lantus and Actos, discontinued metformin.    Hypothermia -Resolved -Blood cultures negative- likely secondary to hypoglycemia  DM (diabetes mellitus) -Hemoglobin A1C 6.6 -CBGs stable- resume home medication regimen   Hypertension -Blood pressure currently controlled without use of antihypertensives -On discharge resumed Toprol-XL on lower dose, continue to hold amlodipine and Cozaar   Discharge Condition: Stable  Diet recommendation: Carb modified  Filed Weights   11/25/13 0534 11/26/13 0638 11/27/13 0525  Weight: 65.59 kg (144 lb  9.6 oz) 67.631 kg (149 lb 1.6 oz) 67.9 kg (149 lb 11.1 oz)      Procedures:  Venous Doppler- No evidence of DVT  Consultations:  Dr. Reather Laurence of Nephrology  Dr. Alen Blew of Oncology  Dr. Risa Grill of Urology   Discharge Exam: Filed Vitals:   12/01/13 0516  BP: 126/79  Pulse: 89  Temp: 98.7 F (37.1 C)  Resp: 18    Gen Exam: Awake and alert with clear speech, in mild distress.  Neck: Supple, No JVD, no masses, no lymphadenopathy Chest: B/L Clear with equal chest rise.  CVS: S1 S2 Regular, no murmurs.  Abdomen: Soft, BS +, non tender, non distended.  Extremities: no edema, lower extremities warm to touch.  Neurologic: Non Focal.  Skin: No Rash or lesions Wounds: N/A.    Discharge Instructions      Discharge Orders   Future Orders Complete By Expires   Diet Carb Modified  As directed    Increase activity slowly  As directed        Medication List    STOP taking these medications       amLODipine 5 MG tablet  Commonly known as:  NORVASC     losartan 50 MG tablet  Commonly known as:  COZAAR     metFORMIN 1000 MG tablet  Commonly known as:  GLUCOPHAGE     pioglitazone 45 MG tablet  Commonly known as:  ACTOS      TAKE these medications       aspirin 81 MG tablet  Take 81 mg by mouth daily.     atorvastatin 20 MG tablet  Commonly known as:  LIPITOR  Take 20 mg by mouth daily.     cyanocobalamin 1000 MCG/ML injection  Commonly known as:  (VITAMIN B-12)  Inject 1 mL (1,000 mcg total) into the muscle daily.     folic acid 1 MG tablet  Commonly known as:  FOLVITE  Take 1 tablet (1 mg total) by mouth daily.     insulin glargine 100 UNIT/ML injection  Commonly known as:  LANTUS  Inject 0.15 mLs (15 Units total) into the skin daily.     metoprolol succinate 12.5 mg Tb24 24 hr tablet  Commonly known as:  TOPROL-XL  Take 2 tablets (50 mg total) by mouth daily. Take with or immediately following a meal.     multivitamins with iron Tabs tablet  Take 1 tablet by mouth daily.     pantoprazole 40 MG tablet   Commonly known as:  PROTONIX  Take 1 tablet (40 mg total) by mouth daily at 12 noon.       Allergies  Allergen Reactions  . Altace [Ramipril]     weakness   Follow-up Information   Follow up with Woody Seller, MD In 1 week.   Specialty:  Family Medicine   Contact information:   [address] [place] [address] [phone]       Follow up with Ulla Potash., MD In 1 week.   Specialty:  Nephrology   Contact information:   [place] [address] [phone]        The results of significant diagnostics from this hospitalization (including imaging, microbiology, ancillary and laboratory) are listed below for reference.    Significant Diagnostic Studies: Dg Chest 2 View  11/27/2013   CLINICAL DATA:  Shortness of breath.  Chest pain.  Weakness.  EXAM: CHEST  2 VIEW  COMPARISON:  11/22/2013  FINDINGS: There are new small bilateral pleural effusions. Heart size is normal. Pulmonary vascularity has increased. There is slight atelectasis at the bases posteriorly. No acute osseous abnormality.  IMPRESSION: New pulmonary vascular congestion and small bilateral pleural effusions and bibasilar atelectasis. Findings probably represent fluid overload.   Electronically Signed   By: Rozetta Nunnery M.D.   On: 11/27/2013 15:50   Ct Head Wo Contrast  11/21/2013   CLINICAL DATA:  Altered mental status  EXAM: CT HEAD WITHOUT CONTRAST  TECHNIQUE: Contiguous axial images were obtained from the base of the skull through the vertex without intravenous contrast. Study was obtained within 24 hr of patient's arrival at the emergency department.  COMPARISON:  None.  FINDINGS: There is moderate diffuse atrophy. There is no mass, hemorrhage, extra-axial fluid collection, or midline shift. There is mild small vessel disease in the centra semiovale bilaterally. Gray-white compartments elsewhere appear normal. There is no demonstrable acute infarct.  Bony calvarium  appears intact. The mastoid air cells are clear. There is opacification of multiple ethmoid sinuses bilaterally. There is mild mucosal thickening in both maxillary antra.  IMPRESSION: Moderate diffuse atrophy with mild periventricular small vessel disease. No intracranial mass, hemorrhage or acute appearing infarct. Multifocal paranasal sinus disease bilaterally.   Electronically Signed   By: Lowella Grip M.D.   On: 11/21/2013 15:49   US Renal Port  11/23/2013   CLINICAL DATA:  Acute renal failure.  EXAM: RENAL/URINARY TRACT ULTRASOUND COMPLETE  COMPARISON:  US RENAL dated 08/26/2012  FINDINGS: Right Kidney:  Length: 12.5 cm. Increased echogenicity. Multiple small simple appearing renal cysts. No hydronephrosis.  Left Kidney:  Length: 12.6 cm. Increased echogenicity. Multiple small simple appearing cysts. No hydronephrosis.  Bladder:  Collapsed around a Foley catheter.  IMPRESSION: 1.  No hydronephrosis. 2. Increased renal echogenicity, suggesting medical renal disease.   Electronically Signed   By: Abigail Miyamoto M.D.   On: 11/23/2013 12:37   Dg Chest Port 1 View  11/22/2013   CLINICAL DATA:  Atelectasis  EXAM: PORTABLE CHEST - 1 VIEW  COMPARISON:  Chest x-ray from yesterday  FINDINGS: The previously reported nodular density in the left mid lung faintly persists. It is ovoid, with the largest dimension measuring 40mm. There is no edema or lobar consolidation. No evidence of effusion (as permitted by exclusion of lateral costophrenic sulci sign) or pneumothorax. Normal heart size.  IMPRESSION: Faintly persistent 2 cm nodule in the left chest. Chest CT followup is recommended to exclude a nodule. Preferably, this would be accomplished on an outpatient basis - when there has been time for clearing of any inflammatory or infectious infiltrates.   Electronically Signed   By: Jorje Guild M.D.   On: 11/22/2013 08:17   Dg Chest Port 1 View  11/21/2013   CLINICAL DATA:  Weakness  EXAM: PORTABLE CHEST - 1  VIEW  COMPARISON:  None.  FINDINGS: Cardiac shadow is within normal limits. The lungs are well aerated bilaterally. There is a questionable nodular density in the left mid lung superimposed over the anterior aspect of the third and fourth ribs. It measures approximately 17 mm. This may be related to overlying artifact. Short-term followup to assess for resolution is recommended.  IMPRESSION: Questionable nodular density in the left mid lung. Short-term followup is recommended. If the lesion persists, nonemergent CT of the chest is recommended for further evaluation.   Electronically Signed   By: Inez Catalina M.D.   On: 11/21/2013 15:24    Microbiology: Recent Results (from  the past 240 hour(s))  CULTURE, BLOOD (ROUTINE X 2)     Status: None   Collection Time    11/21/13  1:50 PM      Result Value Ref Range Status   Specimen Description BLOOD RIGHT ARM   Final   Special Requests     Final   Value: Immunocompromised BOTTLES DRAWN AEROBIC AND ANAEROBIC 5CC EACH   Culture  Setup Time     Final   Value: 11/21/2013 20:43     Performed at [facility]   Culture     Final   Value: NO GROWTH 5 DAYS     Performed at [facility]   Report Status 11/27/2013 FINAL   Final  CULTURE, BLOOD (ROUTINE X 2)     Status: None   Collection Time    11/21/13  2:15 PM      Result Value Ref Range Status   Specimen Description BLOOD RIGHT RADIAL   Final   Special Requests     Final   Value: Immunocompromised BOTTLES DRAWN AEROBIC AND ANAEROBIC 5CC EACH   Culture  Setup Time     Final   Value: 11/21/2013 20:43     Performed at [facility]   Culture     Final   Value: NO GROWTH 5 DAYS     Performed at [facility]   Report Status 11/27/2013 FINAL   Final  MRSA PCR SCREENING     Status: Abnormal   Collection Time    11/21/13  6:21 PM      Result Value Ref Range Status   MRSA by PCR POSITIVE (*) NEGATIVE Final   Comment:            The GeneXpert MRSA Assay (FDA     approved  for NASAL specimens     only), is one component of a     comprehensive MRSA colonization     surveillance program. It is not     intended to diagnose MRSA     infection nor to guide or     monitor treatment for     MRSA infections.     RESULT CALLED TO, READ BACK BY AND VERIFIED WITH:     T TOMLINSON RN 2225 11/21/13 A BROWNING     Labs: Basic Metabolic Panel:  Recent Labs Lab 11/25/13 1650 11/26/13 0500 11/26/13 1240 11/27/13 0651 11/27/13 1200 11/28/13 0700 11/29/13 1140 11/30/13 0715 12/01/13 0732  NA 136*  --  137  --  136* 138 134* 133* 137  K 4.0  --  3.8  --  4.0 4.6 3.8 4.2 4.4  CL 93*  --  96  --  95* 97 94* 94* 97  CO2 29  --  25  --  $R'26 24 24 24 25  'FH$ GLUCOSE 208*  --  209*  --  142* 181* 221* 216* 141*  BUN 61*  --  59*  --  60* 64* 64* 66* 66*  CREATININE 5.50*  --  5.39*  --  5.21* 5.41* 5.25* 5.05* 5.18*  CALCIUM 7.2*  --  7.2*  --  7.3* 8.0* 8.0* 8.0* 8.3*  MG  --  1.5  --  1.4*  --  1.9  --   --   --   PHOS 3.7  --  3.4  --   --  4.1 3.9 3.7 4.2   Liver Function Tests:  Recent Labs Lab 11/26/13 1240 11/28/13 0700 11/29/13 1140 11/30/13  0715 12/01/13 0732  ALBUMIN 2.3* 2.5* 2.2* 2.1* 2.2*   No results found for this basename: LIPASE, AMYLASE,  in the last 168 hours No results found for this basename: AMMONIA,  in the last 168 hours CBC:  Recent Labs Lab 11/25/13 0500 11/26/13 0500 11/27/13 1200 11/28/13 0700 11/30/13 0715 12/01/13 0732  WBC 3.5* 3.5* 3.6* 5.7 3.2* 3.6*  NEUTROABS 2.5  --   --   --   --   --   HGB 7.4* 7.1* 6.5* 9.4* 8.1* 8.7*  HCT 21.5* 20.8* 19.5* 27.5* 23.8* 25.5*  MCV 103.4* 103.5* 106.0* 98.9 99.2 100.8*  PLT 72* 66* 66* 78* 92* 117*   Cardiac Enzymes: No results found for this basename: CKTOTAL, CKMB, CKMBINDEX, TROPONINI,  in the last 168 hours BNP: BNP (last 3 results) No results found for this basename: PROBNP,  in the last 8760 hours CBG:  Recent Labs Lab 11/30/13 0732 11/30/13 1209 11/30/13 1659  11/30/13 2134 12/01/13 0745  GLUCAP 236* 195* 128* 156* 138*       Signed:  Kona Yusuf A  [facility] 12/01/2013, 10:45 AM

## 2013-12-01 NOTE — Progress Notes (Signed)
Micheal Goodman to be D/C'd Skilled nursing facility per MD order.  Discussed with the patient and all questions fully answered.    Medication List    STOP taking these medications       amLODipine 5 MG tablet  Commonly known as:  NORVASC     losartan 50 MG tablet  Commonly known as:  COZAAR     metFORMIN 1000 MG tablet  Commonly known as:  GLUCOPHAGE     pioglitazone 45 MG tablet  Commonly known as:  ACTOS      TAKE these medications       aspirin 81 MG tablet  Take 81 mg by mouth daily.     atorvastatin 20 MG tablet  Commonly known as:  LIPITOR  Take 20 mg by mouth daily.     cyanocobalamin 1000 MCG/ML injection  Commonly known as:  (VITAMIN B-12)  Inject 1 mL (1,000 mcg total) into the muscle daily.     folic acid 1 MG tablet  Commonly known as:  FOLVITE  Take 1 tablet (1 mg total) by mouth daily.     insulin glargine 100 UNIT/ML injection  Commonly known as:  LANTUS  Inject 0.15 mLs (15 Units total) into the skin daily.     metoprolol succinate 12.5 mg Tb24 24 hr tablet  Commonly known as:  TOPROL-XL  Take 2 tablets (50 mg total) by mouth daily. Take with or immediately following a meal.     multivitamins with iron Tabs tablet  Take 1 tablet by mouth daily.     pantoprazole 40 MG tablet  Commonly known as:  PROTONIX  Take 1 tablet (40 mg total) by mouth daily at 12 noon.       Pt transported via EMS.    [name] 12/01/2013 12:10 PM

## 2013-12-05 ENCOUNTER — Non-Acute Institutional Stay (SKILLED_NURSING_FACILITY): Payer: BC Managed Care – PPO | Admitting: Internal Medicine

## 2013-12-05 ENCOUNTER — Encounter: Payer: Self-pay | Admitting: Internal Medicine

## 2013-12-05 DIAGNOSIS — N179 Acute kidney failure, unspecified: Secondary | ICD-10-CM

## 2013-12-05 DIAGNOSIS — R5383 Other fatigue: Secondary | ICD-10-CM

## 2013-12-05 DIAGNOSIS — R531 Weakness: Secondary | ICD-10-CM

## 2013-12-05 DIAGNOSIS — D649 Anemia, unspecified: Secondary | ICD-10-CM

## 2013-12-05 DIAGNOSIS — K219 Gastro-esophageal reflux disease without esophagitis: Secondary | ICD-10-CM

## 2013-12-05 DIAGNOSIS — I10 Essential (primary) hypertension: Secondary | ICD-10-CM

## 2013-12-05 DIAGNOSIS — R5381 Other malaise: Secondary | ICD-10-CM

## 2013-12-05 DIAGNOSIS — E119 Type 2 diabetes mellitus without complications: Secondary | ICD-10-CM

## 2013-12-05 NOTE — Progress Notes (Signed)
Patient ID: Micheal Goodman, male   DOB: June 24, 1951, 63 y.o.   MRN: [id number]     Armandina Gemma living [place]   PCP: Woody Seller, MD  Code Status: full code  Allergies  Allergen Reactions  . Altace [Ramipril]     weakness    Chief Complaint: new admission  HPI:  63 y/o male pt is here for STR after hospital admission with presyncope and hypoglycemia. He was in the hospital from 11/21/13- 12/01/13. He also was noted to have acute renal failure and nephrology was consulted. He was started on iv fluids. ATN in setting of use of ARB worsened with use of metformin was thought to be the cause. Creatine did come down from 7.4 to 5.4 at time of discharge from hospital.  He has history of HTN, DM, basal cell carcinoma and anemia among others.  He was seen in his room. He has question about his metformin, pioglitazone and losartan being held (he was taking them at home). He has been working with therapy team and denies any other complaints  Review of Systems:  Constitutional: Negative for fever, chills, weight loss, malaise/fatigue and diaphoresis.  HENT: Negative for congestion, hearing loss and sore throat.   Eyes: Negative for eye pain, blurred vision, double vision and discharge.  Respiratory: Negative for cough, sputum production, shortness of breath and wheezing.   Cardiovascular: Negative for chest pain, palpitations, orthopnea and leg swelling.  Gastrointestinal: Negative for heartburn, nausea, vomiting, abdominal pain, diarrhea and constipation.  Genitourinary: Negative for dysuria, urgency, frequency, hematuria and flank pain.  Musculoskeletal: Negative for back pain, falls, joint pain and myalgias.  Skin: Negative for itching and rash.  Neurological: Negative for dizziness, tingling, focal weakness and headaches.  Psychiatric/Behavioral: Negative for depression and memory loss. The patient is not nervous/anxious.     Past Medical History  Diagnosis Date  . Hypertension   .  Diabetes mellitus without complication   . Arthritis   . Wears glasses   . Anemia   . Basal cell carcinoma    Past Surgical History  Procedure Laterality Date  . Retinal detachment surgery  2004    left  . Skin split graft N/A 01/13/2013    Procedure: SPLIT THICKNESS SKIN GRAFT OF THE FOREHEAD;  Surgeon: Theodoro Kos, DO;  Location: [place];  Service: Plastics;  Laterality: N/A;  Forehead;  Donor site right thigh. Split thickness skin graft   Social History:   reports that he has never smoked. He does not have any smokeless tobacco history on file. He reports that he does not drink alcohol or use illicit drugs.  No family history on file.  Medications: Patient's Medications  New Prescriptions   No medications on file  Previous Medications   ASPIRIN 81 MG TABLET    Take 81 mg by mouth daily.   ATORVASTATIN (LIPITOR) 20 MG TABLET    Take 20 mg by mouth daily.   CYANOCOBALAMIN (,VITAMIN B-12,) 1000 MCG/ML INJECTION    Inject 1 mL (1,000 mcg total) into the muscle daily.   FOLIC ACID (FOLVITE) 1 MG TABLET    Take 1 tablet (1 mg total) by mouth daily.   INSULIN GLARGINE (LANTUS) 100 UNIT/ML INJECTION    Inject 0.15 mLs (15 Units total) into the skin daily.   METOPROLOL SUCCINATE (TOPROL-XL) 12.5 MG TB24 24 HR TABLET    Take 2 tablets (50 mg total) by mouth daily. Take with or immediately following a meal.   MULTIPLE VITAMINS-IRON (MULTIVITAMINS WITH IRON)  TABS    Take 1 tablet by mouth daily.   PANTOPRAZOLE (PROTONIX) 40 MG TABLET    Take 1 tablet (40 mg total) by mouth daily at 12 noon.  Modified Medications   No medications on file  Discontinued Medications   No medications on file     Physical Exam:  Filed Vitals:   12/05/13 1056  BP: 123/62  Pulse: 83  Temp: 97.7 F (36.5 C)  Resp: 18  Height: 5\' 10"  (1.778 m)  Weight: 157 lb (71.215 kg)  SpO2: 91%    General- elderly male in no acute distress Head- atraumatic, normocephalic Eyes- PERRLA, EOMI, no  pallor, no icterus, no discharge Neck- no lymphadenopathy, no thyromegaly Cardiovascular- normal s1,s2, no murmurs/ rubs/ gallops Respiratory- bilateral clear to auscultation, no wheeze, no rhonchi, no crackles, no use of accessory muscles Abdomen- bowel sounds present, soft, non tender Musculoskeletal- able to move all 4 extremities, no leg edema, weakness present Neurological- no focal deficit Skin- warm and dry Psychiatry- alert and oriented to person, place and time, normal mood and affect   Labs reviewed: Basic Metabolic Panel:  Recent Labs  49/20/10 0500  11/27/13 0651  11/28/13 0700 11/29/13 1140 11/30/13 0715 12/01/13 0732  NA  --   < >  --   < > 138 134* 133* 137  K  --   < >  --   < > 4.6 3.8 4.2 4.4  CL  --   < >  --   < > 97 94* 94* 97  CO2  --   < >  --   < > 24 24 24 25   GLUCOSE  --   < >  --   < > 181* 221* 216* 141*  BUN  --   < >  --   < > 64* 64* 66* 66*  CREATININE  --   < >  --   < > 5.41* 5.25* 5.05* 5.18*  CALCIUM  --   < >  --   < > 8.0* 8.0* 8.0* 8.3*  MG 1.5  --  1.4*  --  1.9  --   --   --   PHOS  --   < >  --   --  4.1 3.9 3.7 4.2  < > = values in this interval not displayed. Liver Function Tests:  Recent Labs  11/21/13 1350  11/29/13 1140 11/30/13 0715 12/01/13 0732  AST 9  --   --   --   --   ALT <5  --   --   --   --   ALKPHOS 45  --   --   --   --   BILITOT 0.2*  --   --   --   --   PROT 6.2  --   --   --   --   ALBUMIN 3.2*  < > 2.2* 2.1* 2.2*  < > = values in this interval not displayed. No results found for this basename: LIPASE, AMYLASE,  in the last 8760 hours No results found for this basename: AMMONIA,  in the last 8760 hours CBC:  Recent Labs  01/13/13 1226 11/21/13 1350  11/25/13 0500  11/28/13 0700 11/30/13 0715 12/01/13 0732  WBC  --  5.4  < > 3.5*  < > 5.7 3.2* 3.6*  NEUTROABS  --  4.6  --  2.5  --   --   --   --   HGB 11.6* 8.5*  < >  7.4*  < > 9.4* 8.1* 8.7*  HCT 34.0* 25.5*  < > 21.5*  < > 27.5* 23.8* 25.5*    MCV  --  110.9*  < > 103.4*  < > 98.9 99.2 100.8*  PLT  --  126*  < > 72*  < > 78* 92* 117*  < > = values in this interval not displayed. Cardiac Enzymes:  Recent Labs  11/21/13 1350  CKTOTAL 37  TROPONINI <0.30   BNP: No components found with this basename: POCBNP,  CBG:  Recent Labs  11/30/13 2134 12/01/13 0745 12/01/13 1143  GLUCAP 156* 138* 160*    Radiological Exams: Dg Chest 2 View  11/27/2013   CLINICAL DATA:  Shortness of breath.  Chest pain.  Weakness.  EXAM: CHEST  2 VIEW  COMPARISON:  11/22/2013  FINDINGS: There are new small bilateral pleural effusions. Heart size is normal. Pulmonary vascularity has increased. There is slight atelectasis at the bases posteriorly. No acute osseous abnormality.  IMPRESSION: New pulmonary vascular congestion and small bilateral pleural effusions and bibasilar atelectasis. Findings probably represent fluid overload.   Electronically Signed   By: Rozetta Nunnery M.D.   On: 11/27/2013 15:50   Ct Head Wo Contrast  11/21/2013   CLINICAL DATA:  Altered mental status  EXAM: CT HEAD WITHOUT CONTRAST  TECHNIQUE: Contiguous axial images were obtained from the base of the skull through the vertex without intravenous contrast. Study was obtained within 24 hr of patient's arrival at the emergency department.  COMPARISON:  None.  FINDINGS: There is moderate diffuse atrophy. There is no mass, hemorrhage, extra-axial fluid collection, or midline shift. There is mild small vessel disease in the centra semiovale bilaterally. Gray-white compartments elsewhere appear normal. There is no demonstrable acute infarct.  Bony calvarium appears intact. The mastoid air cells are clear. There is opacification of multiple ethmoid sinuses bilaterally. There is mild mucosal thickening in both maxillary antra.  IMPRESSION: Moderate diffuse atrophy with mild periventricular small vessel disease. No intracranial mass, hemorrhage or acute appearing infarct. Multifocal paranasal  sinus disease bilaterally.   Electronically Signed   By: Lowella Grip M.D.   On: 11/21/2013 15:49   US Renal Port  11/23/2013   CLINICAL DATA:  Acute renal failure.  EXAM: RENAL/URINARY TRACT ULTRASOUND COMPLETE  COMPARISON:  US RENAL dated 08/26/2012  FINDINGS: Right Kidney:  Length: 12.5 cm. Increased echogenicity. Multiple small simple appearing renal cysts. No hydronephrosis.  Left Kidney:  Length: 12.6 cm. Increased echogenicity. Multiple small simple appearing cysts. No hydronephrosis.  Bladder:  Collapsed around a Foley catheter.  IMPRESSION: 1.  No hydronephrosis. 2. Increased renal echogenicity, suggesting medical renal disease.   Electronically Signed   By: Abigail Miyamoto M.D.   On: 11/23/2013 12:37   Dg Chest Port 1 View  11/22/2013   CLINICAL DATA:  Atelectasis  EXAM: PORTABLE CHEST - 1 VIEW  COMPARISON:  Chest x-ray from yesterday  FINDINGS: The previously reported nodular density in the left mid lung faintly persists. It is ovoid, with the largest dimension measuring 35mm. There is no edema or lobar consolidation. No evidence of effusion (as permitted by exclusion of lateral costophrenic sulci sign) or pneumothorax. Normal heart size.  IMPRESSION: Faintly persistent 2 cm nodule in the left chest. Chest CT followup is recommended to exclude a nodule. Preferably, this would be accomplished on an outpatient basis - when there has been time for clearing of any inflammatory or infectious infiltrates.   Electronically Signed   By: Roderic Palau  Watts M.D.   On: 11/22/2013 08:17   Dg Chest Port 1 View  11/21/2013   CLINICAL DATA:  Weakness  EXAM: PORTABLE CHEST - 1 VIEW  COMPARISON:  None.  FINDINGS: Cardiac shadow is within normal limits. The lungs are well aerated bilaterally. There is a questionable nodular density in the left mid lung superimposed over the anterior aspect of the third and fourth ribs. It measures approximately 17 mm. This may be related to overlying artifact. Short-term followup  to assess for resolution is recommended.  IMPRESSION: Questionable nodular density in the left mid lung. Short-term followup is recommended. If the lesion persists, nonemergent CT of the chest is recommended for further evaluation.   Electronically Signed   By: Inez Catalina M.D.   On: 11/21/2013 15:24    Assessment/Plan  Generalized weakness- here for rehabilitation. Will have him work with PT and OT for strengthening exercises.   Renal failure- monitor kidney function. Avoid NSAIDs and nephrotoxic agents. Hold his home regimen of metformin, ARB for now. Has renal follow up  Anemia- s/p 3 u prbc transfusion in hospital. Is on aspirin.Monitor hemoglobin/ hct  Dm type 2- hold off oral hypoglycemic agents for now. Reviewed last a1c. Continue lantus for now with SSI. Monitor cbg and adjust dose if needed. On aspirin and atorvastatin  HTN- bp stable. Continue toprol xl and monitor bp readings  GERD- continue protonix for now 40 mg daily  Family/ staff Communication: reviewed care plan with patient and nursing supervisor. Explained about reason for holding his metformin and pioglitazone along with losartan for now   Goals of care: STR   Labs/tests ordered- cbc, cmp    Blanchie Serve, MD  [facility] [phone] (Monday-Friday 8 am - 5 pm) [phone] (afterhours)

## 2013-12-06 DIAGNOSIS — I10 Essential (primary) hypertension: Secondary | ICD-10-CM | POA: Insufficient documentation

## 2013-12-06 DIAGNOSIS — E119 Type 2 diabetes mellitus without complications: Secondary | ICD-10-CM | POA: Insufficient documentation

## 2013-12-16 ENCOUNTER — Encounter: Payer: Self-pay | Admitting: Internal Medicine

## 2013-12-16 ENCOUNTER — Non-Acute Institutional Stay: Payer: Self-pay | Admitting: Internal Medicine

## 2013-12-16 DIAGNOSIS — I10 Essential (primary) hypertension: Secondary | ICD-10-CM

## 2013-12-16 DIAGNOSIS — E119 Type 2 diabetes mellitus without complications: Secondary | ICD-10-CM

## 2013-12-16 DIAGNOSIS — R2681 Unsteadiness on feet: Secondary | ICD-10-CM | POA: Insufficient documentation

## 2013-12-16 DIAGNOSIS — E162 Hypoglycemia, unspecified: Secondary | ICD-10-CM

## 2013-12-16 DIAGNOSIS — R0902 Hypoxemia: Secondary | ICD-10-CM

## 2013-12-16 DIAGNOSIS — N179 Acute kidney failure, unspecified: Secondary | ICD-10-CM

## 2013-12-16 DIAGNOSIS — D649 Anemia, unspecified: Secondary | ICD-10-CM

## 2013-12-16 NOTE — Assessment & Plan Note (Addendum)
-  No hypoglycemic episodes noted in SNF -Continue holding Metformin and Pioglitazone

## 2013-12-16 NOTE — Assessment & Plan Note (Signed)
-  Independent with rolling walker -[facility] for supplies -Needs assist with other ADL's

## 2013-12-16 NOTE — Progress Notes (Unsigned)
Patient ID: Micheal Goodman, male   DOB: September 18, 1950, 63 y.o.   MRN: [id number]  Provider:  Rexene Edison. Mariea Clonts, D.O., C.M.D.  Location:  [place]  PCP: Woody Seller, MD  Code Status: FULL CODE  Allergies  Allergen Reactions  . Altace [Ramipril]     weakness    Chief Complaint  Patient presents with  . Discharge Note    HPI: 63 y.o. male with a history of DM II, HTN, anemia, basal cell carcinoma, and arthritis who is being discharged today from short term rehabilitation.  He received PT and OT post discharge hospitalization for hypoglycemia and presyncope.  Medications on hold are Metformin, Pioglitazone, and Losartan.  He is now able to ambulate independently with a rolling walker; and still needs assist and supervision with medications, finances, and safety acts.  Berg Balance score 28/56; gait instability and impaired memory and cognition.  Being discharged home today with family and [place].  Requiring 2L nasal cannula oxygen due to 85% at rest, and 82% with exercise on room air.   ROS: ROS  Past Medical History  Diagnosis Date  . Hypertension   . Diabetes mellitus without complication   . Arthritis   . Wears glasses   . Anemia   . Basal cell carcinoma    Past Surgical History  Procedure Laterality Date  . Retinal detachment surgery  2004    left  . Skin split graft N/A 01/13/2013    Procedure: SPLIT THICKNESS SKIN GRAFT OF THE FOREHEAD;  Surgeon: Theodoro Kos, DO;  Location: [place];  Service: Plastics;  Laterality: N/A;  Forehead;  Donor site right thigh. Split thickness skin graft   Social History:   reports that he has never smoked. He does not have any smokeless tobacco history on file. He reports that he does not drink alcohol or use illicit drugs.  Family History  Problem Relation Age of Onset  . Family history unknown: Yes    Medications: Patient's Medications  New Prescriptions   No medications on file    Previous Medications   ASPIRIN 81 MG TABLET    Take 81 mg by mouth daily.   ATORVASTATIN (LIPITOR) 20 MG TABLET    Take 20 mg by mouth daily.   CYANOCOBALAMIN (,VITAMIN B-12,) 1000 MCG/ML INJECTION    Inject 1 mL (1,000 mcg total) into the muscle daily.   FOLIC ACID (FOLVITE) 1 MG TABLET    Take 1 tablet (1 mg total) by mouth daily.   INSULIN GLARGINE (LANTUS) 100 UNIT/ML INJECTION    Inject 0.15 mLs (15 Units total) into the skin daily.   LINAGLIPTIN (TRADJENTA) 5 MG TABS TABLET    Take 5 mg by mouth daily.   METOPROLOL SUCCINATE (TOPROL-XL) 12.5 MG TB24 24 HR TABLET    Take 2 tablets (50 mg total) by mouth daily. Take with or immediately following a meal.   MULTIPLE VITAMINS-IRON (MULTIVITAMINS WITH IRON) TABS    Take 1 tablet by mouth daily.   PANTOPRAZOLE (PROTONIX) 40 MG TABLET    Take 1 tablet (40 mg total) by mouth daily at 12 noon.  Modified Medications   No medications on file  Discontinued Medications   No medications on file     Physical Exam: Filed Vitals:   12/16/13 1022  BP: 122/76  Pulse: 78  Temp: 97.2 F (36.2 C)  Resp: 20  Height: 5\' 10"  (1.778 m)  Weight: 144 lb 9.6 oz (65.59 kg)  SpO2: 92%  Physical Exam   Labs reviewed: Basic Metabolic Panel: 89/[birth year]/1941: Na 137, K 6, BUN 70, Cre 4.14, Ca 7.8  Recent Labs  11/26/13 0500  11/27/13 0651  11/28/13 0700 11/29/13 1140 11/30/13 0715 12/01/13 0732  NA  --   < >  --   < > 138 134* 133* 137  K  --   < >  --   < > 4.6 3.8 4.2 4.4  CL  --   < >  --   < > 97 94* 94* 97  CO2  --   < >  --   < > 24 24 24 25   GLUCOSE  --   < >  --   < > 181* 221* 216* 141*  BUN  --   < >  --   < > 64* 64* 66* 66*  CREATININE  --   < >  --   < > 5.41* 5.25* 5.05* 5.18*  CALCIUM  --   < >  --   < > 8.0* 8.0* 8.0* 8.3*  MG 1.5  --  1.4*  --  1.9  --   --   --   PHOS  --   < >  --   --  4.1 3.9 3.7 4.2  < > = values in this interval not displayed. Liver Function Tests:  Recent Labs  11/21/13 1350  11/29/13 1140  11/30/13 0715 12/01/13 0732  AST 9  --   --   --   --   ALT <5  --   --   --   --   ALKPHOS 45  --   --   --   --   BILITOT 0.2*  --   --   --   --   PROT 6.2  --   --   --   --   ALBUMIN 3.2*  < > 2.2* 2.1* 2.2*  < > = values in this interval not displayed. No results found for this basename: LIPASE, AMYLASE,  in the last 8760 hours No results found for this basename: AMMONIA,  in the last 8760 hours CBC: 12/08/2013: WBC 2.9, Hemoglobin 7.8, Hematocrit 23.5, Plt 255  Recent Labs  01/13/13 1226 11/21/13 1350  11/25/13 0500  11/28/13 0700 11/30/13 0715 12/01/13 0732  WBC  --  5.4  < > 3.5*  < > 5.7 3.2* 3.6*  NEUTROABS  --  4.6  --  2.5  --   --   --   --   HGB 11.6* 8.5*  < > 7.4*  < > 9.4* 8.1* 8.7*  HCT 34.0* 25.5*  < > 21.5*  < > 27.5* 23.8* 25.5*  MCV  --  110.9*  < > 103.4*  < > 98.9 99.2 100.8*  PLT  --  126*  < > 72*  < > 78* 92* 117*  < > = values in this interval not displayed. Cardiac Enzymes:  Recent Labs  11/21/13 1350  CKTOTAL 37  TROPONINI <0.30    CBG: 12/15/2013: 339 12/14/2013: 213 12/12/2013: 392 12/11/2013: 425 12/10/2013: 358  Recent Labs  11/30/13 2134 12/01/13 0745 12/01/13 1143  GLUCAP 156* 138* 160*    Imaging and Procedures:  Assessment/Plan DM (diabetes mellitus) -Blood glucose range 213-425 mostly. A few times 54-80's.    -Add Tradjenta 5mg  PO daily with evening meals -AC and HS blood sugar monitoring.  -Last HgbA1c 6.6 on 11/22/2013. -Continue Lantus -Carb controlled no added salt diet  Hypoglycemia -No hypoglycemic episodes noted in SNF -Continue holding Metformin and Pioglitazone  Essential hypertension, benign -BP stable -Continue to hold Losartan   Anemia -Hemoglobin 7.8 and Hematocrit 23.5, slight drop since hospitalization -Continue Vitamin B 12 injections, folic acid, and multivitamin with iron  Acute renal failure -Creatinine improved to 4.14 -To follow-up with Nephrology, Dr. Posey Pronto   Gait  instability -Independent with rolling walker -[facility] supplies -Needs assist with other ADL's  Hypoxia -remain on 2L oxygen nasal cannula    Functional status:  Family/ staff Communication:   Labs/tests ordered:

## 2013-12-16 NOTE — Assessment & Plan Note (Addendum)
-  Blood glucose range 213-425 mostly. A few times 54-80's.    -Add Tradjenta 5mg  PO daily with evening meals -AC and HS blood sugar monitoring.  -Last HgbA1c 6.6 on 11/22/2013. -Continue Lantus -Carb controlled no added salt diet

## 2013-12-16 NOTE — Assessment & Plan Note (Signed)
-  BP stable -Continue to hold Losartan

## 2013-12-16 NOTE — Assessment & Plan Note (Signed)
-  remain on 2L oxygen nasal cannula

## 2013-12-16 NOTE — Assessment & Plan Note (Addendum)
-  Creatinine improved to 4.14 -To follow-up with Nephrology, Dr. Posey Pronto

## 2013-12-16 NOTE — Assessment & Plan Note (Signed)
-  Hemoglobin 7.8 and Hematocrit 23.5, slight drop since hospitalization -Continue Vitamin B 12 injections, folic acid, and multivitamin with iron

## 2013-12-19 ENCOUNTER — Encounter: Payer: Self-pay | Admitting: Internal Medicine

## 2013-12-19 ENCOUNTER — Non-Acute Institutional Stay: Payer: BC Managed Care – PPO | Admitting: Internal Medicine

## 2013-12-19 DIAGNOSIS — R2681 Unsteadiness on feet: Secondary | ICD-10-CM

## 2013-12-19 DIAGNOSIS — E1149 Type 2 diabetes mellitus with other diabetic neurological complication: Secondary | ICD-10-CM

## 2013-12-19 DIAGNOSIS — E162 Hypoglycemia, unspecified: Secondary | ICD-10-CM

## 2013-12-19 DIAGNOSIS — R269 Unspecified abnormalities of gait and mobility: Secondary | ICD-10-CM

## 2013-12-19 DIAGNOSIS — E1142 Type 2 diabetes mellitus with diabetic polyneuropathy: Secondary | ICD-10-CM | POA: Insufficient documentation

## 2013-12-19 NOTE — Progress Notes (Signed)
Patient ID: Micheal Goodman, male   DOB: 1951-08-21, 63 y.o.   MRN: [id number]    Armandina Gemma living [facility]  Chief Complaint  Patient presents with  . Acute Visit    hypoglycemia, need for wheelchair   Allergies  Allergen Reactions  . Altace [Ramipril]     weakness   HPI 63 y/o male patient is seen today for low blood sugar reading. His cbg was 35 this am with patient feeling weak and light headed. He was recently started on tradjenta and following this he has had low blood sugar readings 35, 48, 82 with other readings between 98-325 He is being discharged home today  His repeat sugar reading on review in 3 He has unsteady gait and neuropathy and PT team advises for a wheelchair to help with his balance and prevent falls Patient seen in his room with his caregiver present. He denies any complaints at present  Review of Systems  Constitutional: Negative for fever, chills and diaphoresis.  HENT: Negative for congestion, hearing loss and sore throat.   Eyes: Negative for blurred vision, double vision and discharge.  Respiratory: Negative for cough, sputum production, shortness of breath and wheezing.   Cardiovascular: Negative for chest pain, palpitations, orthopnea and leg swelling.  Gastrointestinal: Negative for heartburn, nausea, vomiting, abdominal pain Genitourinary: Negative for dysuria, urgency, frequency and flank pain.  Musculoskeletal: Negative for back pain, falls, joint pain and myalgias.  Skin: Negative for itching and rash.  Neurological: Positive for weakness but improved since admission. Negative for dizziness, tingling, focal weakness and headaches.  Psychiatric/Behavioral: Negative for depression and memory loss. The patient is not nervous/anxious.    Past Medical History  Diagnosis Date  . Hypertension   . Diabetes mellitus without complication   . Arthritis   . Wears glasses   . Anemia   . Basal cell carcinoma    Past Surgical History  Procedure Laterality  Date  . Retinal detachment surgery  2004    left  . Skin split graft N/A 01/13/2013    Procedure: SPLIT THICKNESS SKIN GRAFT OF THE FOREHEAD;  Surgeon: Theodoro Kos, DO;  Location: [place];  Service: Plastics;  Laterality: N/A;  Forehead;  Donor site right thigh. Split thickness skin graft   Current Outpatient Prescriptions on File Prior to Visit  Medication Sig Dispense Refill  . aspirin 81 MG tablet Take 81 mg by mouth daily.      Marland Kitchen atorvastatin (LIPITOR) 20 MG tablet Take 20 mg by mouth daily.      . cyanocobalamin (,VITAMIN B-12,) 1000 MCG/ML injection Inject 1 mL (1,000 mcg total) into the muscle daily.  1 mL  0  . folic acid (FOLVITE) 1 MG tablet Take 1 tablet (1 mg total) by mouth daily.      . metoprolol succinate (TOPROL-XL) 12.5 mg TB24 24 hr tablet Take 2 tablets (50 mg total) by mouth daily. Take with or immediately following a meal.      . Multiple Vitamins-Iron (MULTIVITAMINS WITH IRON) TABS Take 1 tablet by mouth daily.      . pantoprazole (PROTONIX) 40 MG tablet Take 1 tablet (40 mg total) by mouth daily at 12 noon.  30 tablet  0   No current facility-administered medications on file prior to visit.    Physical exam BP 139/67  Pulse 77  Temp(Src) 97.8 F (36.6 C)  Resp 18  Ht 5\' 10"  (1.778 m)  Wt 143 lb (64.864 kg)  BMI 20.52 kg/m2  SpO2 90%  General- elderly male in no acute distress Head- atraumatic, normocephalic Eyes- PERRLA, EOMI, no pallor, no icterus, no discharge Neck- no lymphadenopathy, no thyromegaly Cardiovascular- normal s1,s2, no murmurs/ rubs/ gallops Respiratory- bilateral clear to auscultation, no wheeze, no rhonchi, no crackles, no use of accessory muscles Abdomen- bowel sounds present, soft, non tender Musculoskeletal- able to move all 4 extremities, no leg edema, using a wheelchair and walker in the facility Neurological- no focal deficit Skin- warm and dry Psychiatry- alert and oriented to person, place and time, normal  mood and affect   Assessment/plan  Hypoglycemia Has had hypoglycemic episodes in the facility. Will stop his tradjenta for now. Continue holding his oral hypoglycemics. Explained to patient about warning signs with hypoglycemia and advised on having gluose tablets with him.   DM with neuropathy Has had elevated blood sugar during the day of 200-300s. Will increase his basal insulin to 18 u lantus daily for now. To monitor cbg bid for now and review with pcp on 12/26/13. Carb controlled no added salt diet  Gait instability Unsteady gait and using wheelchair in facility along with rolling walker. PT recommends wheelchair for home. Script will be provided. Continue home PT services

## 2014-01-12 ENCOUNTER — Other Ambulatory Visit: Payer: Self-pay | Admitting: Internal Medicine

## 2014-02-10 ENCOUNTER — Other Ambulatory Visit: Payer: Self-pay | Admitting: Internal Medicine

## 2014-02-13 ENCOUNTER — Other Ambulatory Visit: Payer: Self-pay | Admitting: Internal Medicine

## 2014-08-31 ENCOUNTER — Other Ambulatory Visit (HOSPITAL_COMMUNITY): Payer: Self-pay | Admitting: *Deleted

## 2014-09-01 ENCOUNTER — Encounter (HOSPITAL_COMMUNITY)
Admission: RE | Admit: 2014-09-01 | Discharge: 2014-09-01 | Disposition: A | Discharge status: Home / Self Care | Payer: BC Managed Care – PPO | Source: Ambulatory Visit | Attending: Nephrology | Admitting: Nephrology

## 2014-09-01 DIAGNOSIS — D638 Anemia in other chronic diseases classified elsewhere: Secondary | ICD-10-CM | POA: Insufficient documentation

## 2014-09-01 DIAGNOSIS — N184 Chronic kidney disease, stage 4 (severe): Secondary | ICD-10-CM | POA: Diagnosis not present

## 2014-09-01 LAB — POCT HEMOGLOBIN-HEMACUE: Hemoglobin: 9.4 g/dL — ABNORMAL LOW (ref 13.0–17.0)

## 2014-09-01 MED ORDER — EPOETIN ALFA 10000 UNIT/ML IJ SOLN
INTRAMUSCULAR | Status: AC
  Filled 2014-09-01: qty 1

## 2014-09-01 MED ORDER — EPOETIN ALFA 10000 UNIT/ML IJ SOLN
10000.0000 [IU] | INTRAMUSCULAR | Status: DC
  Administered 2014-09-01: 10000 [IU] via SUBCUTANEOUS

## 2014-09-01 NOTE — Discharge Instructions (Signed)

## 2014-09-08 ENCOUNTER — Encounter (HOSPITAL_COMMUNITY)
Admission: RE | Admit: 2014-09-08 | Discharge: 2014-09-08 | Disposition: A | Discharge status: Home / Self Care | Payer: BC Managed Care – PPO | Source: Ambulatory Visit | Attending: Nephrology | Admitting: Nephrology

## 2014-09-08 DIAGNOSIS — D638 Anemia in other chronic diseases classified elsewhere: Secondary | ICD-10-CM | POA: Diagnosis not present

## 2014-09-08 LAB — POCT HEMOGLOBIN-HEMACUE: Hemoglobin: 9.7 g/dL — ABNORMAL LOW (ref 13.0–17.0)

## 2014-09-08 MED ORDER — EPOETIN ALFA 10000 UNIT/ML IJ SOLN
10000.0000 [IU] | INTRAMUSCULAR | Status: DC
  Administered 2014-09-08: 10000 [IU] via SUBCUTANEOUS

## 2014-09-08 MED ORDER — EPOETIN ALFA 10000 UNIT/ML IJ SOLN
INTRAMUSCULAR | Status: AC
  Filled 2014-09-08: qty 1

## 2014-09-16 ENCOUNTER — Encounter (HOSPITAL_COMMUNITY)
Admission: RE | Admit: 2014-09-16 | Discharge: 2014-09-16 | Disposition: A | Discharge status: Home / Self Care | Payer: BLUE CROSS/BLUE SHIELD | Source: Ambulatory Visit | Attending: Nephrology | Admitting: Nephrology

## 2014-09-16 DIAGNOSIS — N184 Chronic kidney disease, stage 4 (severe): Secondary | ICD-10-CM | POA: Insufficient documentation

## 2014-09-16 DIAGNOSIS — D638 Anemia in other chronic diseases classified elsewhere: Secondary | ICD-10-CM | POA: Insufficient documentation

## 2014-09-16 LAB — POCT HEMOGLOBIN-HEMACUE: Hemoglobin: 9.5 g/dL — ABNORMAL LOW (ref 13.0–17.0)

## 2014-09-16 MED ORDER — EPOETIN ALFA 10000 UNIT/ML IJ SOLN
10000.0000 [IU] | INTRAMUSCULAR | Status: DC
  Administered 2014-09-16: 10000 [IU] via SUBCUTANEOUS

## 2014-09-16 MED ORDER — EPOETIN ALFA 10000 UNIT/ML IJ SOLN
INTRAMUSCULAR | Status: AC
  Administered 2014-09-16: 10000 [IU] via SUBCUTANEOUS
  Filled 2014-09-16: qty 1

## 2014-09-24 ENCOUNTER — Encounter (HOSPITAL_COMMUNITY)
Admission: RE | Admit: 2014-09-24 | Discharge: 2014-09-24 | Disposition: A | Discharge status: Home / Self Care | Payer: BLUE CROSS/BLUE SHIELD | Source: Ambulatory Visit | Attending: Nephrology | Admitting: Nephrology

## 2014-09-24 DIAGNOSIS — D638 Anemia in other chronic diseases classified elsewhere: Secondary | ICD-10-CM | POA: Diagnosis not present

## 2014-09-24 LAB — POCT HEMOGLOBIN-HEMACUE: HEMOGLOBIN: 10.7 g/dL — AB (ref 13.0–17.0)

## 2014-09-24 MED ORDER — EPOETIN ALFA 10000 UNIT/ML IJ SOLN
INTRAMUSCULAR | Status: AC
  Administered 2014-09-24: 10000 [IU] via SUBCUTANEOUS
  Filled 2014-09-24: qty 1

## 2014-09-24 MED ORDER — EPOETIN ALFA 10000 UNIT/ML IJ SOLN
10000.0000 [IU] | INTRAMUSCULAR | Status: DC
  Administered 2014-09-24: 10000 [IU] via SUBCUTANEOUS

## 2014-10-01 ENCOUNTER — Encounter (HOSPITAL_COMMUNITY)
Admission: RE | Admit: 2014-10-01 | Discharge: 2014-10-01 | Disposition: A | Discharge status: Home / Self Care | Payer: BLUE CROSS/BLUE SHIELD | Source: Ambulatory Visit | Attending: Nephrology | Admitting: Nephrology

## 2014-10-01 DIAGNOSIS — D638 Anemia in other chronic diseases classified elsewhere: Secondary | ICD-10-CM | POA: Diagnosis not present

## 2014-10-01 MED ORDER — EPOETIN ALFA 10000 UNIT/ML IJ SOLN
INTRAMUSCULAR | Status: AC
  Filled 2014-10-01: qty 1

## 2014-10-01 MED ORDER — EPOETIN ALFA 10000 UNIT/ML IJ SOLN
10000.0000 [IU] | INTRAMUSCULAR | Status: DC
  Administered 2014-10-01: 10000 [IU] via SUBCUTANEOUS

## 2014-10-02 LAB — POCT HEMOGLOBIN-HEMACUE: Hemoglobin: 11.4 g/dL — ABNORMAL LOW (ref 13.0–17.0)

## 2014-10-08 ENCOUNTER — Encounter (HOSPITAL_COMMUNITY)
Admission: RE | Admit: 2014-10-08 | Discharge: 2014-10-08 | Disposition: A | Discharge status: Home / Self Care | Payer: BLUE CROSS/BLUE SHIELD | Source: Ambulatory Visit | Attending: Nephrology | Admitting: Nephrology

## 2014-10-08 DIAGNOSIS — D638 Anemia in other chronic diseases classified elsewhere: Secondary | ICD-10-CM | POA: Diagnosis not present

## 2014-10-08 LAB — IRON AND TIBC
IRON: 47 ug/dL (ref 42–165)
SATURATION RATIOS: 19 % — AB (ref 20–55)
TIBC: 249 ug/dL (ref 215–435)
UIBC: 202 ug/dL (ref 125–400)

## 2014-10-08 LAB — FERRITIN: Ferritin: 174 ng/mL (ref 22–322)

## 2014-10-08 MED ORDER — EPOETIN ALFA 10000 UNIT/ML IJ SOLN
10000.0000 [IU] | INTRAMUSCULAR | Status: DC
  Administered 2014-10-08: 10000 [IU] via SUBCUTANEOUS

## 2014-10-08 MED ORDER — EPOETIN ALFA 10000 UNIT/ML IJ SOLN
INTRAMUSCULAR | Status: AC
  Filled 2014-10-08: qty 1

## 2014-10-08 MED ORDER — CLONIDINE HCL 0.1 MG PO TABS: 0.1000 mg | ORAL_TABLET | Freq: Once | ORAL | Status: AC | PRN

## 2014-10-09 LAB — POCT HEMOGLOBIN-HEMACUE: HEMOGLOBIN: 11.5 g/dL — AB (ref 13.0–17.0)

## 2014-10-14 ENCOUNTER — Encounter (HOSPITAL_COMMUNITY)
Admission: RE | Admit: 2014-10-14 | Discharge: 2014-10-14 | Disposition: A | Discharge status: Home / Self Care | Payer: BLUE CROSS/BLUE SHIELD | Source: Ambulatory Visit | Attending: Nephrology | Admitting: Nephrology

## 2014-10-14 DIAGNOSIS — D638 Anemia in other chronic diseases classified elsewhere: Secondary | ICD-10-CM | POA: Diagnosis present

## 2014-10-14 DIAGNOSIS — N184 Chronic kidney disease, stage 4 (severe): Secondary | ICD-10-CM | POA: Insufficient documentation

## 2014-10-14 LAB — POCT HEMOGLOBIN-HEMACUE: Hemoglobin: 11.1 g/dL — ABNORMAL LOW (ref 13.0–17.0)

## 2014-10-14 MED ORDER — SODIUM CHLORIDE 0.9 % IV SOLN
510.0000 mg | INTRAVENOUS | Status: DC
  Administered 2014-10-14: 510 mg via INTRAVENOUS
  Filled 2014-10-14: qty 17

## 2014-10-14 MED ORDER — EPOETIN ALFA 10000 UNIT/ML IJ SOLN
10000.0000 [IU] | INTRAMUSCULAR | Status: DC
  Administered 2014-10-14: 10000 [IU] via SUBCUTANEOUS

## 2014-10-14 MED ORDER — EPOETIN ALFA 10000 UNIT/ML IJ SOLN
INTRAMUSCULAR | Status: AC
  Filled 2014-10-14: qty 1

## 2014-10-14 NOTE — Discharge Instructions (Signed)

## 2014-10-22 ENCOUNTER — Encounter (HOSPITAL_COMMUNITY)
Admission: RE | Admit: 2014-10-22 | Discharge: 2014-10-22 | Disposition: A | Discharge status: Home / Self Care | Payer: BLUE CROSS/BLUE SHIELD | Source: Ambulatory Visit | Attending: Nephrology | Admitting: Nephrology

## 2014-10-22 DIAGNOSIS — D638 Anemia in other chronic diseases classified elsewhere: Secondary | ICD-10-CM | POA: Diagnosis not present

## 2014-10-22 LAB — POCT HEMOGLOBIN-HEMACUE: Hemoglobin: 11.8 g/dL — ABNORMAL LOW (ref 13.0–17.0)

## 2014-10-22 MED ORDER — EPOETIN ALFA 10000 UNIT/ML IJ SOLN
10000.0000 [IU] | INTRAMUSCULAR | Status: DC
  Administered 2014-10-22: 10000 [IU] via SUBCUTANEOUS

## 2014-10-22 MED ORDER — EPOETIN ALFA 10000 UNIT/ML IJ SOLN
INTRAMUSCULAR | Status: AC
  Filled 2014-10-22: qty 1

## 2014-10-22 MED ORDER — SODIUM CHLORIDE 0.9 % IV SOLN
510.0000 mg | INTRAVENOUS | Status: DC
  Administered 2014-10-22: 510 mg via INTRAVENOUS
  Filled 2014-10-22: qty 17

## 2014-10-30 ENCOUNTER — Encounter (HOSPITAL_COMMUNITY)
Admission: RE | Admit: 2014-10-30 | Discharge: 2014-10-30 | Disposition: A | Discharge status: Home / Self Care | Payer: BLUE CROSS/BLUE SHIELD | Source: Ambulatory Visit | Attending: Nephrology | Admitting: Nephrology

## 2014-10-30 DIAGNOSIS — D638 Anemia in other chronic diseases classified elsewhere: Secondary | ICD-10-CM | POA: Diagnosis not present

## 2014-10-30 LAB — POCT HEMOGLOBIN-HEMACUE: HEMOGLOBIN: 12.2 g/dL — AB (ref 13.0–17.0)

## 2014-10-30 MED ORDER — EPOETIN ALFA 10000 UNIT/ML IJ SOLN: 10000.0000 [IU] | INTRAMUSCULAR | Status: DC

## 2014-10-31 MED ORDER — EPOETIN ALFA 10000 UNIT/ML IJ SOLN
INTRAMUSCULAR | Status: AC
  Filled 2014-10-31: qty 1

## 2014-11-12 ENCOUNTER — Encounter (HOSPITAL_COMMUNITY)
Admission: RE | Admit: 2014-11-12 | Discharge: 2014-11-12 | Disposition: A | Discharge status: Home / Self Care | Payer: BLUE CROSS/BLUE SHIELD | Source: Ambulatory Visit | Attending: Nephrology | Admitting: Nephrology

## 2014-11-12 DIAGNOSIS — N184 Chronic kidney disease, stage 4 (severe): Secondary | ICD-10-CM | POA: Diagnosis not present

## 2014-11-12 DIAGNOSIS — D638 Anemia in other chronic diseases classified elsewhere: Secondary | ICD-10-CM | POA: Insufficient documentation

## 2014-11-12 LAB — IRON AND TIBC
IRON: 96 ug/dL (ref 42–165)
SATURATION RATIOS: 45 % (ref 20–55)
TIBC: 215 ug/dL (ref 215–435)
UIBC: 119 ug/dL — AB (ref 125–400)

## 2014-11-12 LAB — FERRITIN: Ferritin: 805 ng/mL — ABNORMAL HIGH (ref 22–322)

## 2014-11-12 LAB — POCT HEMOGLOBIN-HEMACUE: HEMOGLOBIN: 12.5 g/dL — AB (ref 13.0–17.0)

## 2014-11-12 MED ORDER — EPOETIN ALFA 10000 UNIT/ML IJ SOLN: 10000.0000 [IU] | INTRAMUSCULAR | Status: DC

## 2014-11-27 ENCOUNTER — Encounter (HOSPITAL_COMMUNITY)
Admission: RE | Admit: 2014-11-27 | Discharge: 2014-11-27 | Disposition: A | Discharge status: Home / Self Care | Payer: BLUE CROSS/BLUE SHIELD | Source: Ambulatory Visit | Attending: Nephrology | Admitting: Nephrology

## 2014-11-27 DIAGNOSIS — D638 Anemia in other chronic diseases classified elsewhere: Secondary | ICD-10-CM | POA: Diagnosis not present

## 2014-11-27 LAB — POCT HEMOGLOBIN-HEMACUE: HEMOGLOBIN: 12 g/dL — AB (ref 13.0–17.0)

## 2014-11-27 MED ORDER — EPOETIN ALFA 10000 UNIT/ML IJ SOLN: 10000.0000 [IU] | INTRAMUSCULAR | Status: DC

## 2014-12-11 ENCOUNTER — Encounter (HOSPITAL_COMMUNITY)
Admission: RE | Admit: 2014-12-11 | Discharge: 2014-12-11 | Disposition: A | Discharge status: Home / Self Care | Payer: BLUE CROSS/BLUE SHIELD | Source: Ambulatory Visit | Attending: Nephrology | Admitting: Nephrology

## 2014-12-11 DIAGNOSIS — N184 Chronic kidney disease, stage 4 (severe): Secondary | ICD-10-CM | POA: Insufficient documentation

## 2014-12-11 DIAGNOSIS — D638 Anemia in other chronic diseases classified elsewhere: Secondary | ICD-10-CM | POA: Diagnosis not present

## 2014-12-11 LAB — POCT HEMOGLOBIN-HEMACUE: Hemoglobin: 11 g/dL — ABNORMAL LOW (ref 13.0–17.0)

## 2014-12-11 MED ORDER — EPOETIN ALFA 10000 UNIT/ML IJ SOLN
INTRAMUSCULAR | Status: AC
  Filled 2014-12-11: qty 1

## 2014-12-11 MED ORDER — EPOETIN ALFA 10000 UNIT/ML IJ SOLN
10000.0000 [IU] | INTRAMUSCULAR | Status: DC
  Administered 2014-12-11: 10000 [IU] via SUBCUTANEOUS

## 2014-12-12 LAB — IRON AND TIBC
Iron: 117 ug/dL (ref 42–165)
SATURATION RATIOS: 50 % (ref 20–55)
TIBC: 232 ug/dL (ref 215–435)
UIBC: 115 ug/dL — AB (ref 125–400)

## 2014-12-12 LAB — FERRITIN: FERRITIN: 1061 ng/mL — AB (ref 22–322)

## 2014-12-25 ENCOUNTER — Encounter (HOSPITAL_COMMUNITY)
Admission: RE | Admit: 2014-12-25 | Discharge: 2014-12-25 | Disposition: A | Discharge status: Home / Self Care | Payer: BLUE CROSS/BLUE SHIELD | Source: Ambulatory Visit | Attending: Nephrology | Admitting: Nephrology

## 2014-12-25 DIAGNOSIS — D638 Anemia in other chronic diseases classified elsewhere: Secondary | ICD-10-CM | POA: Diagnosis not present

## 2014-12-25 LAB — POCT HEMOGLOBIN-HEMACUE: HEMOGLOBIN: 10.7 g/dL — AB (ref 13.0–17.0)

## 2014-12-25 MED ORDER — EPOETIN ALFA 10000 UNIT/ML IJ SOLN
INTRAMUSCULAR | Status: AC
  Filled 2014-12-25: qty 1

## 2014-12-25 MED ORDER — EPOETIN ALFA 10000 UNIT/ML IJ SOLN
10000.0000 [IU] | INTRAMUSCULAR | Status: DC
  Administered 2014-12-25: 10000 [IU] via SUBCUTANEOUS

## 2015-01-04 ENCOUNTER — Ambulatory Visit: Payer: BLUE CROSS/BLUE SHIELD | Admitting: Dietician

## 2015-01-07 ENCOUNTER — Encounter (HOSPITAL_COMMUNITY)
Admission: RE | Admit: 2015-01-07 | Discharge: 2015-01-07 | Disposition: A | Discharge status: Home / Self Care | Payer: BLUE CROSS/BLUE SHIELD | Source: Ambulatory Visit | Attending: Nephrology | Admitting: Nephrology

## 2015-01-07 DIAGNOSIS — D638 Anemia in other chronic diseases classified elsewhere: Secondary | ICD-10-CM | POA: Diagnosis not present

## 2015-01-07 LAB — POCT HEMOGLOBIN-HEMACUE: Hemoglobin: 10.7 g/dL — ABNORMAL LOW (ref 13.0–17.0)

## 2015-01-07 LAB — IRON AND TIBC
IRON: 72 ug/dL (ref 42–165)
Saturation Ratios: 32 % (ref 20–55)
TIBC: 222 ug/dL (ref 215–435)
UIBC: 150 ug/dL (ref 125–400)

## 2015-01-07 LAB — FERRITIN: Ferritin: 1198 ng/mL — ABNORMAL HIGH (ref 22–322)

## 2015-01-07 MED ORDER — EPOETIN ALFA 10000 UNIT/ML IJ SOLN
INTRAMUSCULAR | Status: AC
  Administered 2015-01-07: 10000 [IU] via SUBCUTANEOUS
  Filled 2015-01-07: qty 1

## 2015-01-07 MED ORDER — EPOETIN ALFA 10000 UNIT/ML IJ SOLN
10000.0000 [IU] | INTRAMUSCULAR | Status: DC
  Administered 2015-01-07: 10000 [IU] via SUBCUTANEOUS

## 2015-01-22 ENCOUNTER — Encounter (HOSPITAL_COMMUNITY)
Admission: RE | Admit: 2015-01-22 | Discharge: 2015-01-22 | Disposition: A | Discharge status: Home / Self Care | Payer: BLUE CROSS/BLUE SHIELD | Source: Ambulatory Visit | Attending: Nephrology | Admitting: Nephrology

## 2015-01-22 DIAGNOSIS — N184 Chronic kidney disease, stage 4 (severe): Secondary | ICD-10-CM | POA: Diagnosis not present

## 2015-01-22 DIAGNOSIS — D638 Anemia in other chronic diseases classified elsewhere: Secondary | ICD-10-CM | POA: Diagnosis present

## 2015-01-22 LAB — POCT HEMOGLOBIN-HEMACUE: Hemoglobin: 9.3 g/dL — ABNORMAL LOW (ref 13.0–17.0)

## 2015-01-22 MED ORDER — EPOETIN ALFA 10000 UNIT/ML IJ SOLN
INTRAMUSCULAR | Status: AC
  Filled 2015-01-22: qty 1

## 2015-01-22 MED ORDER — EPOETIN ALFA 10000 UNIT/ML IJ SOLN
10000.0000 [IU] | INTRAMUSCULAR | Status: DC
  Administered 2015-01-22: 10000 [IU] via SUBCUTANEOUS

## 2015-01-25 ENCOUNTER — Encounter: Payer: BLUE CROSS/BLUE SHIELD | Attending: Nephrology | Admitting: Dietician

## 2015-01-25 ENCOUNTER — Encounter: Payer: Self-pay | Admitting: Dietician

## 2015-01-25 VITALS — Ht 70.0 in | Wt 146.0 lb

## 2015-01-25 DIAGNOSIS — Z713 Dietary counseling and surveillance: Secondary | ICD-10-CM | POA: Insufficient documentation

## 2015-01-25 DIAGNOSIS — N184 Chronic kidney disease, stage 4 (severe): Secondary | ICD-10-CM | POA: Insufficient documentation

## 2015-01-25 NOTE — Progress Notes (Signed)
  Medical Nutrition Therapy:  Appt start time: 1115 end time:  6270.   Assessment:  Primary concerns today: "Kidney diet". Patient is here is his brother and sister in law.  He has been diagnosed with type 2 diabetes since 1996.  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  GFR 14-17.  Patient checks CBG bid- am: 130, 9pm: 200-250.  UBW 146 lbs for 10-15 years.  HgbA1C per patient 7.3% two months ago increased from 6.6% 11/22/13.   BUN:  68, Creat:  4.36, Potassium:  5.1, Phosphorus:  5.0 (11/27/14().  Patient lives alone. Brother takes him to the store.  He knows how to cook very little.  Patient is unable to drive. Retired.  Preferred Learning Style:   No preference indicated   Learning Readiness:   Ready  MEDICATIONS: see list   DIETARY INTAKE:  Does not cook much and eats TV dinners often.    24-hr recall:  B ( AM): 1-2 slices [place] ham or out to eat once a week (hashbrowns, bacon, grits, eggs, 1 slice white toast with jelly or [employer] frozen breakfast foods Snk ( AM): butter cookies occasionally  L ( PM): [employer] restaurant:  Spaghetti (this makes 3 meals) or steak at [employer] Snk ( PM): regular popcorn D ( PM): TV dinner or leftovers Snk ( PM): popcorn, occasional ice cream Beverages: diet Mt. Dew, water, occasional diet Pepsi  Usual physical activity: ADL's  Estimated energy needs: 2000 calories 70 g protein 2000 mg sodium 2000 mg potassium  Progress Towards Goal(s):  In progress.   Nutritional Diagnosis:  NB-1.1 Food and nutrition-related knowledge deficit As related to renal diet.  As evidenced by diet hx and patient report.    Intervention:  Nutrition Counseling/Education regarding a renal diet.  Label reading, seasoning, tips for eating out, and meal planning discussed.    Consider Boost Glucose Control or Nepro once daily Avoid added salt, processed meat, and canned foods unless no added salt. Try Mrs. Dash or Salt Free 17 or other no sodium herbs  and spices to season, lemon juice or lime juice. When eating out, ask them to prepare your food to order without salt  Teaching Method Utilized:  Visual Auditory  Handouts given during visit include:  Choose a meal CKD diet book  Label reading  Will mail sample meal plan with basic cooking instructions  Barriers to learning/adherence to lifestyle change: none  Demonstrated degree of understanding via:  Teach Back   Monitoring/Evaluation:  Dietary intake, label reading, and body weight prn.

## 2015-01-25 NOTE — Patient Instructions (Signed)
Consider Boost Glucose Control or Nepro once daily Avoid added salt, processed meat, and canned foods unless no added salt. Try Mrs. Dash or Salt Free 17 or other no sodium herbs and spices to season, lemon juice or lime juice. When eating out, ask them to prepare your food to order without salt

## 2015-02-05 ENCOUNTER — Encounter (HOSPITAL_COMMUNITY)
Admission: RE | Admit: 2015-02-05 | Discharge: 2015-02-05 | Disposition: A | Discharge status: Home / Self Care | Payer: BLUE CROSS/BLUE SHIELD | Source: Ambulatory Visit | Attending: Nephrology | Admitting: Nephrology

## 2015-02-05 DIAGNOSIS — D638 Anemia in other chronic diseases classified elsewhere: Secondary | ICD-10-CM | POA: Diagnosis not present

## 2015-02-05 LAB — IRON AND TIBC
Iron: 76 ug/dL (ref 45–182)
Saturation Ratios: 29 % (ref 17.9–39.5)
TIBC: 259 ug/dL (ref 250–450)
UIBC: 183 ug/dL

## 2015-02-05 LAB — POCT HEMOGLOBIN-HEMACUE: Hemoglobin: 10.1 g/dL — ABNORMAL LOW (ref 13.0–17.0)

## 2015-02-05 LAB — FERRITIN: Ferritin: 834 ng/mL — ABNORMAL HIGH (ref 24–336)

## 2015-02-05 MED ORDER — EPOETIN ALFA 10000 UNIT/ML IJ SOLN
INTRAMUSCULAR | Status: AC
  Filled 2015-02-05: qty 1

## 2015-02-05 MED ORDER — EPOETIN ALFA 10000 UNIT/ML IJ SOLN
10000.0000 [IU] | INTRAMUSCULAR | Status: DC
  Administered 2015-02-05: 10000 [IU] via SUBCUTANEOUS

## 2015-02-19 ENCOUNTER — Encounter (HOSPITAL_COMMUNITY)
Admission: RE | Admit: 2015-02-19 | Discharge: 2015-02-19 | Disposition: A | Discharge status: Home / Self Care | Payer: BLUE CROSS/BLUE SHIELD | Source: Ambulatory Visit | Attending: Nephrology | Admitting: Nephrology

## 2015-02-19 DIAGNOSIS — D638 Anemia in other chronic diseases classified elsewhere: Secondary | ICD-10-CM | POA: Diagnosis present

## 2015-02-19 DIAGNOSIS — N184 Chronic kidney disease, stage 4 (severe): Secondary | ICD-10-CM | POA: Insufficient documentation

## 2015-02-19 LAB — POCT HEMOGLOBIN-HEMACUE: Hemoglobin: 11 g/dL — ABNORMAL LOW (ref 13.0–17.0)

## 2015-02-19 MED ORDER — EPOETIN ALFA 10000 UNIT/ML IJ SOLN
INTRAMUSCULAR | Status: AC
  Filled 2015-02-19: qty 1

## 2015-02-19 MED ORDER — EPOETIN ALFA 10000 UNIT/ML IJ SOLN
10000.0000 [IU] | INTRAMUSCULAR | Status: DC
  Administered 2015-02-19: 10000 [IU] via SUBCUTANEOUS

## 2015-03-05 ENCOUNTER — Encounter (HOSPITAL_COMMUNITY)
Admission: RE | Admit: 2015-03-05 | Discharge: 2015-03-05 | Disposition: A | Discharge status: Home / Self Care | Payer: BLUE CROSS/BLUE SHIELD | Source: Ambulatory Visit | Attending: Nephrology | Admitting: Nephrology

## 2015-03-05 DIAGNOSIS — D638 Anemia in other chronic diseases classified elsewhere: Secondary | ICD-10-CM | POA: Diagnosis not present

## 2015-03-05 LAB — IRON AND TIBC
IRON: 98 ug/dL (ref 45–182)
Saturation Ratios: 45 % — ABNORMAL HIGH (ref 17.9–39.5)
TIBC: 218 ug/dL — AB (ref 250–450)
UIBC: 120 ug/dL

## 2015-03-05 LAB — FERRITIN: FERRITIN: 650 ng/mL — AB (ref 24–336)

## 2015-03-05 LAB — POCT HEMOGLOBIN-HEMACUE: Hemoglobin: 11.6 g/dL — ABNORMAL LOW (ref 13.0–17.0)

## 2015-03-05 MED ORDER — EPOETIN ALFA 20000 UNIT/ML IJ SOLN
INTRAMUSCULAR | Status: AC
  Administered 2015-03-05: 20000 [IU] via SUBCUTANEOUS
  Filled 2015-03-05: qty 1

## 2015-03-05 MED ORDER — EPOETIN ALFA 20000 UNIT/ML IJ SOLN
20000.0000 [IU] | INTRAMUSCULAR | Status: DC
  Administered 2015-03-05: 20000 [IU] via SUBCUTANEOUS

## 2015-03-19 ENCOUNTER — Encounter (HOSPITAL_COMMUNITY)
Admission: RE | Admit: 2015-03-19 | Discharge: 2015-03-19 | Disposition: A | Discharge status: Home / Self Care | Payer: BLUE CROSS/BLUE SHIELD | Source: Ambulatory Visit | Attending: Nephrology | Admitting: Nephrology

## 2015-03-19 DIAGNOSIS — N184 Chronic kidney disease, stage 4 (severe): Secondary | ICD-10-CM | POA: Diagnosis not present

## 2015-03-19 DIAGNOSIS — D638 Anemia in other chronic diseases classified elsewhere: Secondary | ICD-10-CM | POA: Insufficient documentation

## 2015-03-19 LAB — POCT HEMOGLOBIN-HEMACUE: Hemoglobin: 11.4 g/dL — ABNORMAL LOW (ref 13.0–17.0)

## 2015-03-19 MED ORDER — EPOETIN ALFA 20000 UNIT/ML IJ SOLN
INTRAMUSCULAR | Status: AC
  Administered 2015-03-19: 20000 [IU] via SUBCUTANEOUS
  Filled 2015-03-19: qty 1

## 2015-03-19 MED ORDER — EPOETIN ALFA 20000 UNIT/ML IJ SOLN: 20000.0000 [IU] | INTRAMUSCULAR | Status: DC

## 2015-04-02 ENCOUNTER — Encounter (HOSPITAL_COMMUNITY): Payer: BLUE CROSS/BLUE SHIELD

## 2015-04-12 DEATH — deceased

## 2015-08-05 IMAGING — CT CT HEAD W/O CM
1 series · 16 of 30 positions shown, 20 images · non-contrast
Comparison: None.

CLINICAL DATA: Altered mental status

EXAM:
CT HEAD WITHOUT CONTRAST
TECHNIQUE: Contiguous axial images were obtained from the base of the skull
through the vertex without intravenous contrast. Study was obtained
within 24 hr of patient's arrival at the emergency department.

[Series 2: head 4.8 h37s · axial · 0.49mm/px · z∈[+1060,+1220]mm · 16 of 36 slices shown, 20 images]
[im 2/36  brain]
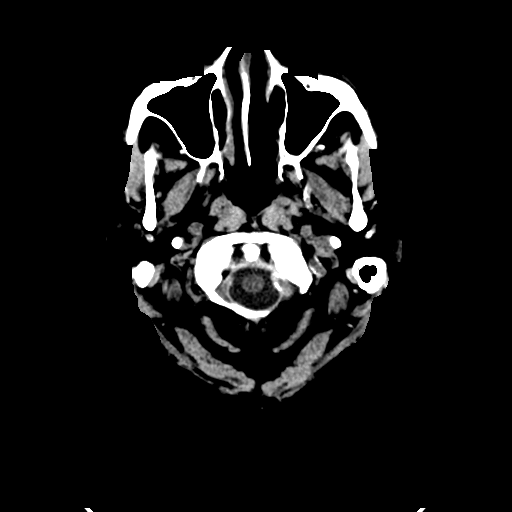
[im 2/36  bone]
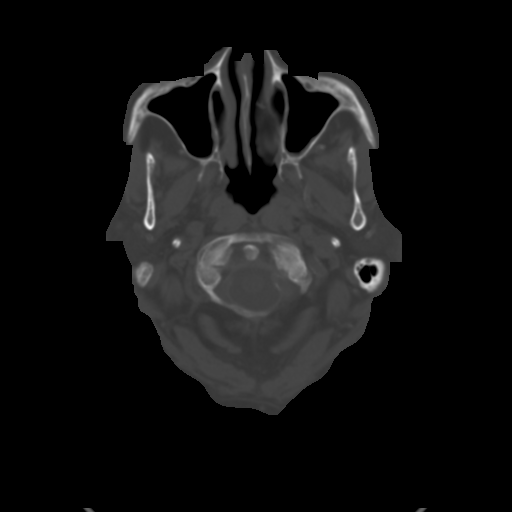
[im 4/36  brain]
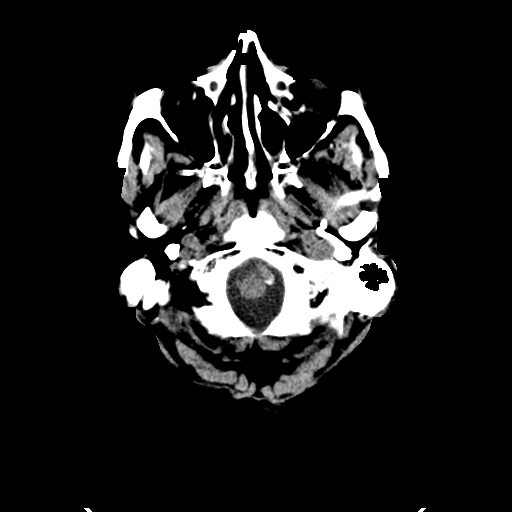
[im 7/36  brain]
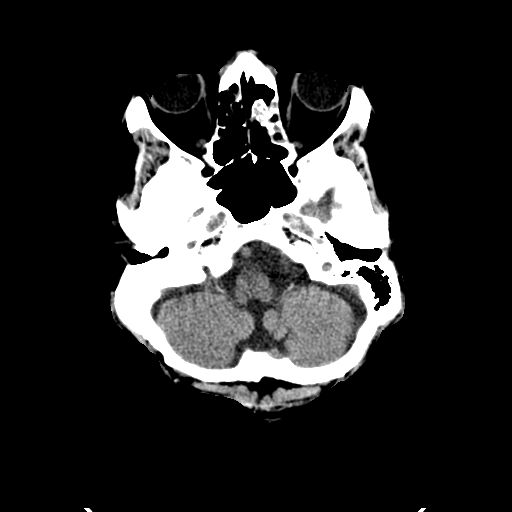
[im 9/36  brain]
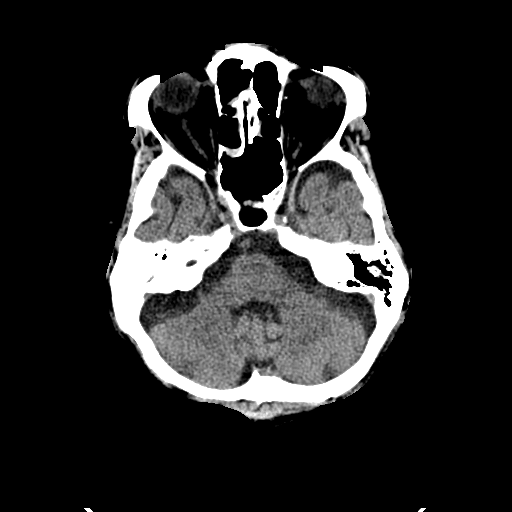
[im 10/36  brain]
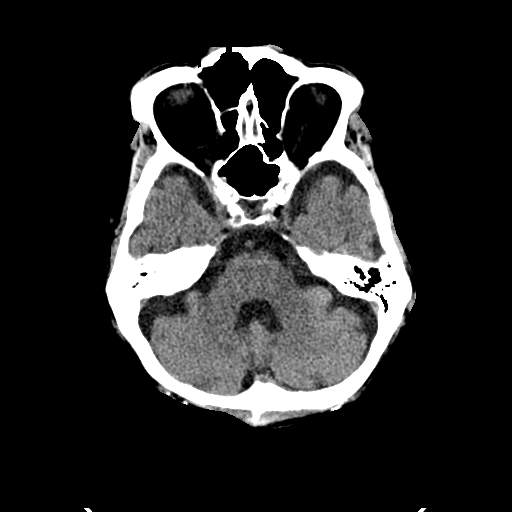
[im 10/36  bone]
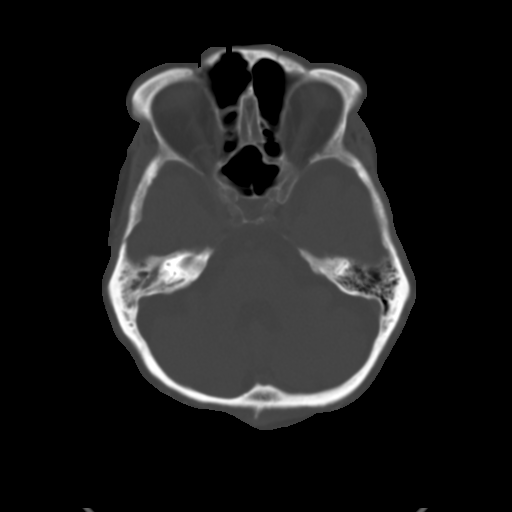
[im 13/36  brain]
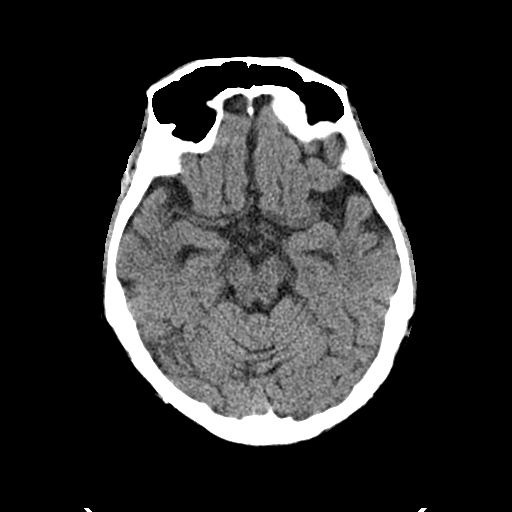
[im 15/36  brain]
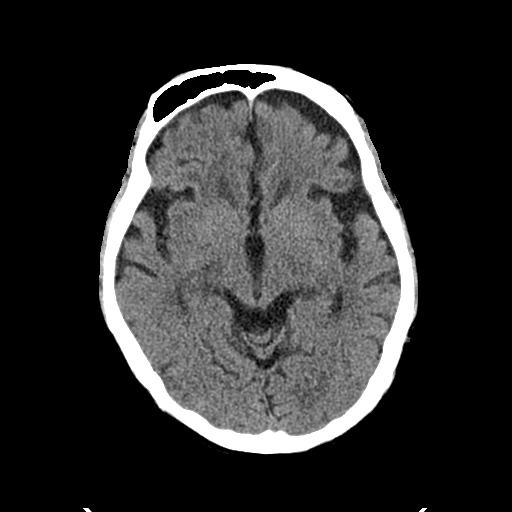
[im 17/36  brain]
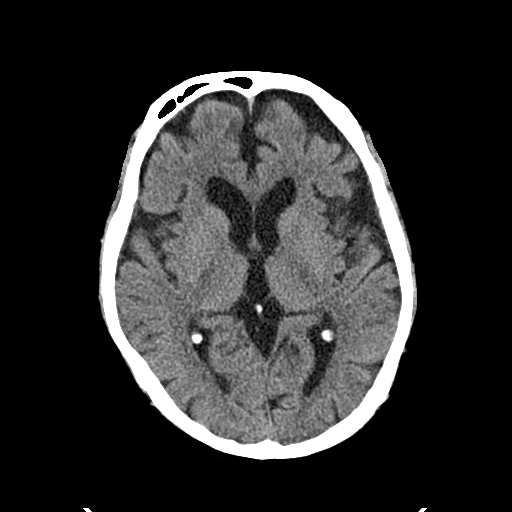
[im 19/36  brain]
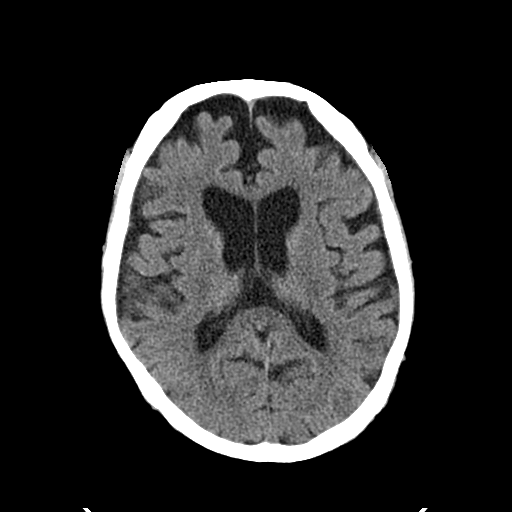
[im 19/36  bone]
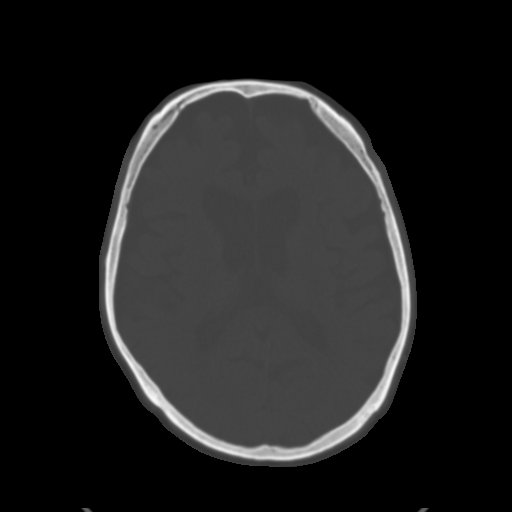
[im 21/36  brain]
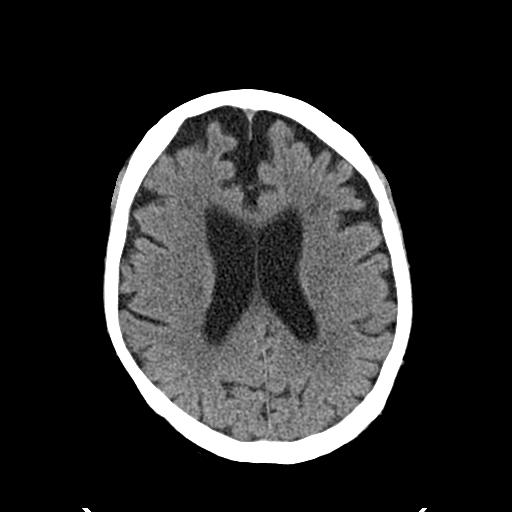
[im 23/36  brain]
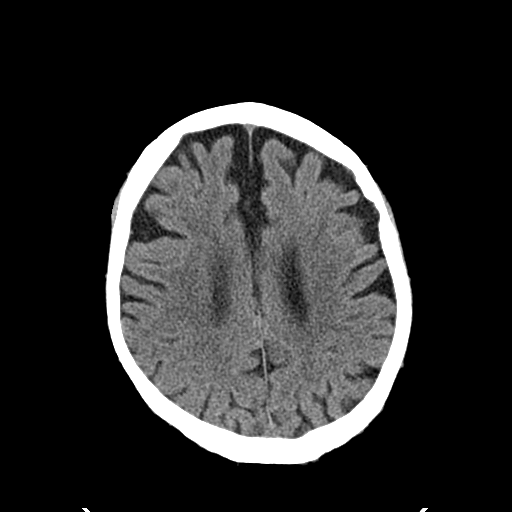
[im 26/36  brain]
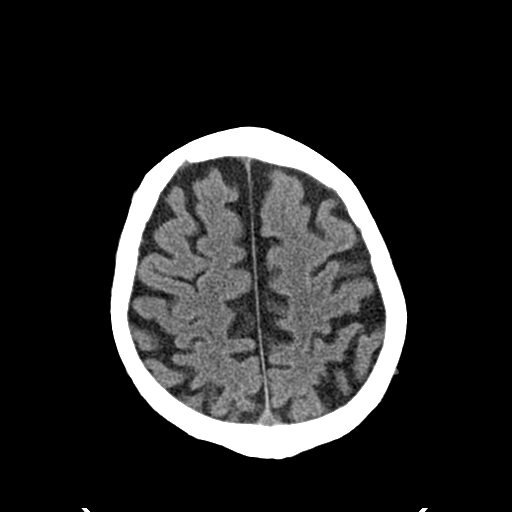
[im 27/36  brain]
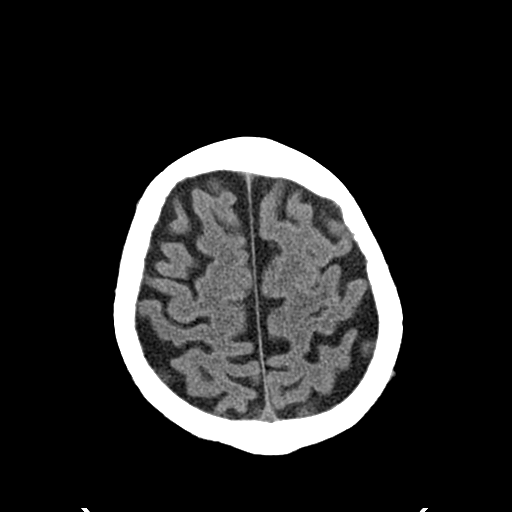
[im 27/36  bone]
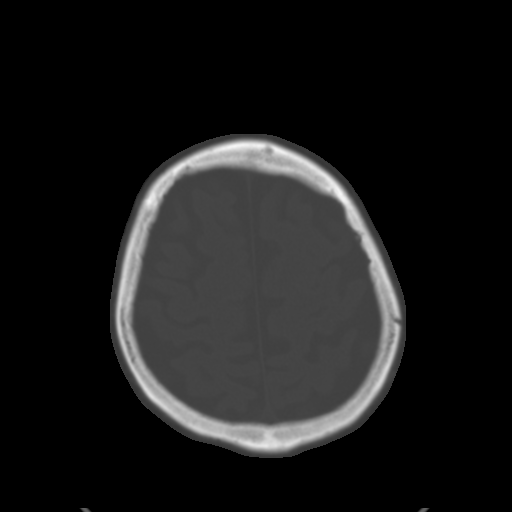
[im 29/36  brain]
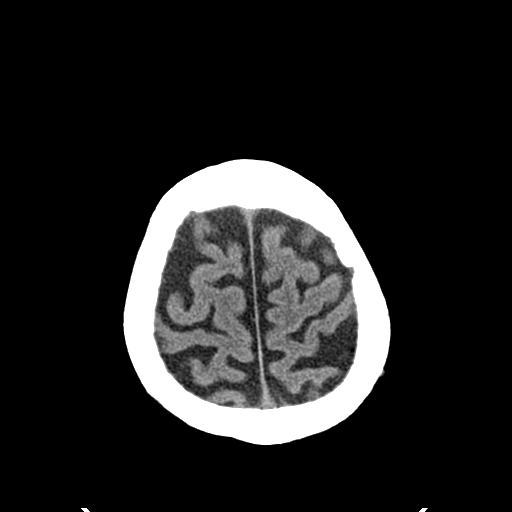
[im 32/36  brain]
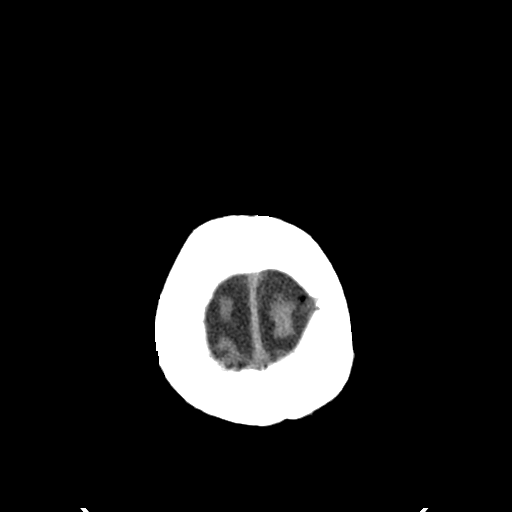
[im 34/36  brain]
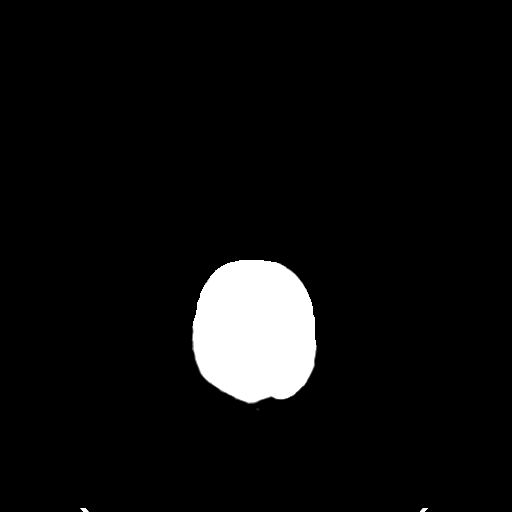

[16 of 30 positions shown; findings below may reference images not displayed]

FINDINGS: There is moderate diffuse atrophy. There is no mass, hemorrhage,
extra-axial fluid collection, or midline shift. There is mild small
vessel disease in the centra semiovale bilaterally. Gray-white
compartments elsewhere appear normal. There is no demonstrable acute
infarct.

Bony calvarium appears intact. The mastoid air cells are clear.
There is opacification of multiple ethmoid sinuses bilaterally.
There is mild mucosal thickening in both maxillary antra.
IMPRESSION: Moderate diffuse atrophy with mild periventricular small vessel
disease. No intracranial mass, hemorrhage or acute appearing
infarct. Multifocal paranasal sinus disease bilaterally.
